# Patient Record
Sex: Female | Born: 1985 | Race: Black or African American | Hispanic: No | Marital: Married | Smoking: Never smoker
Health system: Southern US, Community
[De-identification: ages and names within clinical notes are randomized; demographics above are authoritative.]

## PROBLEM LIST (undated history)

## (undated) DIAGNOSIS — T7840XA Allergy, unspecified, initial encounter: Secondary | ICD-10-CM

## (undated) DIAGNOSIS — E119 Type 2 diabetes mellitus without complications: Secondary | ICD-10-CM

## (undated) DIAGNOSIS — C801 Malignant (primary) neoplasm, unspecified: Secondary | ICD-10-CM

## (undated) DIAGNOSIS — I10 Essential (primary) hypertension: Secondary | ICD-10-CM

## (undated) DIAGNOSIS — D860 Sarcoidosis of lung: Secondary | ICD-10-CM

## (undated) HISTORY — DX: Malignant (primary) neoplasm, unspecified: C80.1

## (undated) HISTORY — DX: Type 2 diabetes mellitus without complications: E11.9

## (undated) HISTORY — PX: LUNG TRANSPLANT, DOUBLE: SHX704

## (undated) HISTORY — DX: Essential (primary) hypertension: I10

## (undated) HISTORY — DX: Sarcoidosis of lung: D86.0

## (undated) HISTORY — DX: Allergy, unspecified, initial encounter: T78.40XA

## (undated) HISTORY — PX: BONE MARROW TRANSPLANT: SHX200

---

## 2012-01-05 DIAGNOSIS — D860 Sarcoidosis of lung: Secondary | ICD-10-CM | POA: Insufficient documentation

## 2012-01-05 DIAGNOSIS — N189 Chronic kidney disease, unspecified: Secondary | ICD-10-CM | POA: Insufficient documentation

## 2012-01-05 DIAGNOSIS — Z862 Personal history of diseases of the blood and blood-forming organs and certain disorders involving the immune mechanism: Secondary | ICD-10-CM | POA: Insufficient documentation

## 2012-01-05 DIAGNOSIS — D649 Anemia, unspecified: Secondary | ICD-10-CM | POA: Insufficient documentation

## 2012-02-22 DIAGNOSIS — A439 Nocardiosis, unspecified: Secondary | ICD-10-CM | POA: Insufficient documentation

## 2012-03-31 ENCOUNTER — Ambulatory Visit: Payer: Self-pay | Admitting: Oncology

## 2012-03-31 LAB — CBC CANCER CENTER
Basophil #: 0.1 x10 3/mm (ref 0.0–0.1)
Basophil %: 2.2 %
Eosinophil #: 0.1 x10 3/mm (ref 0.0–0.7)
Eosinophil %: 1.7 %
HCT: 29 % — ABNORMAL LOW (ref 35.0–47.0)
Lymphocyte #: 1.2 x10 3/mm (ref 1.0–3.6)
Lymphocyte %: 29.7 %
MCH: 26.2 pg (ref 26.0–34.0)
MCHC: 30.3 g/dL — ABNORMAL LOW (ref 32.0–36.0)
MCV: 87 fL (ref 80–100)
Monocyte #: 0.3 x10 3/mm (ref 0.2–0.9)
Monocyte %: 7.2 %
Neutrophil %: 59.2 %
Platelet: 177 x10 3/mm (ref 150–440)
RBC: 3.35 10*6/uL — ABNORMAL LOW (ref 3.80–5.20)
RDW: 14.9 % — ABNORMAL HIGH (ref 11.5–14.5)

## 2012-03-31 LAB — BASIC METABOLIC PANEL
Anion Gap: 7 (ref 7–16)
BUN: 21 mg/dL — ABNORMAL HIGH (ref 7–18)
Creatinine: 1.78 mg/dL — ABNORMAL HIGH (ref 0.60–1.30)
EGFR (African American): 45 — ABNORMAL LOW
EGFR (Non-African Amer.): 38 — ABNORMAL LOW
Glucose: 107 mg/dL — ABNORMAL HIGH (ref 65–99)
Osmolality: 275 (ref 275–301)
Sodium: 136 mmol/L (ref 136–145)

## 2012-03-31 LAB — FOLATE: Folic Acid: 23.5 ng/mL (ref 3.1–100.0)

## 2012-03-31 LAB — RETICULOCYTES: Absolute Retic Count: 0.0424 10*6/uL (ref 0.024–0.084)

## 2012-03-31 LAB — FERRITIN: Ferritin (ARMC): 93 ng/mL (ref 8–388)

## 2012-04-01 LAB — IRON AND TIBC: Iron Bind.Cap.(Total): 291 ug/dL (ref 250–450)

## 2012-04-02 ENCOUNTER — Ambulatory Visit: Payer: Self-pay | Admitting: Oncology

## 2012-04-28 LAB — CANCER CENTER HEMOGLOBIN: HGB: 9.4 g/dL — ABNORMAL LOW (ref 12.0–16.0)

## 2012-05-02 ENCOUNTER — Ambulatory Visit: Payer: Self-pay | Admitting: Oncology

## 2012-05-04 DIAGNOSIS — R0689 Other abnormalities of breathing: Secondary | ICD-10-CM | POA: Insufficient documentation

## 2012-05-04 DIAGNOSIS — G8918 Other acute postprocedural pain: Secondary | ICD-10-CM | POA: Insufficient documentation

## 2012-05-21 DIAGNOSIS — R760 Raised antibody titer: Secondary | ICD-10-CM | POA: Insufficient documentation

## 2012-06-27 DIAGNOSIS — K3184 Gastroparesis: Secondary | ICD-10-CM | POA: Insufficient documentation

## 2012-06-27 DIAGNOSIS — IMO0001 Reserved for inherently not codable concepts without codable children: Secondary | ICD-10-CM | POA: Insufficient documentation

## 2012-07-08 DIAGNOSIS — R05 Cough: Secondary | ICD-10-CM | POA: Insufficient documentation

## 2012-08-17 DIAGNOSIS — I459 Conduction disorder, unspecified: Secondary | ICD-10-CM | POA: Insufficient documentation

## 2012-08-17 DIAGNOSIS — I4891 Unspecified atrial fibrillation: Secondary | ICD-10-CM | POA: Insufficient documentation

## 2012-08-17 DIAGNOSIS — I10 Essential (primary) hypertension: Secondary | ICD-10-CM | POA: Insufficient documentation

## 2012-08-17 DIAGNOSIS — Z7983 Long term (current) use of bisphosphonates: Secondary | ICD-10-CM | POA: Insufficient documentation

## 2012-08-17 DIAGNOSIS — D649 Anemia, unspecified: Secondary | ICD-10-CM | POA: Insufficient documentation

## 2012-08-25 DIAGNOSIS — G723 Periodic paralysis: Secondary | ICD-10-CM | POA: Insufficient documentation

## 2012-09-17 DIAGNOSIS — L0231 Cutaneous abscess of buttock: Secondary | ICD-10-CM | POA: Insufficient documentation

## 2012-09-18 DIAGNOSIS — E119 Type 2 diabetes mellitus without complications: Secondary | ICD-10-CM | POA: Insufficient documentation

## 2012-10-17 DIAGNOSIS — T07XXXA Unspecified multiple injuries, initial encounter: Secondary | ICD-10-CM | POA: Insufficient documentation

## 2012-10-19 DIAGNOSIS — D849 Immunodeficiency, unspecified: Secondary | ICD-10-CM | POA: Insufficient documentation

## 2013-12-15 DIAGNOSIS — R748 Abnormal levels of other serum enzymes: Secondary | ICD-10-CM | POA: Insufficient documentation

## 2014-02-22 DIAGNOSIS — K219 Gastro-esophageal reflux disease without esophagitis: Secondary | ICD-10-CM | POA: Insufficient documentation

## 2014-03-28 DIAGNOSIS — N2889 Other specified disorders of kidney and ureter: Secondary | ICD-10-CM | POA: Insufficient documentation

## 2014-05-08 DIAGNOSIS — N920 Excessive and frequent menstruation with regular cycle: Secondary | ICD-10-CM | POA: Insufficient documentation

## 2014-07-23 DIAGNOSIS — C649 Malignant neoplasm of unspecified kidney, except renal pelvis: Secondary | ICD-10-CM | POA: Insufficient documentation

## 2014-07-23 DIAGNOSIS — R252 Cramp and spasm: Secondary | ICD-10-CM | POA: Insufficient documentation

## 2015-01-15 DIAGNOSIS — D0510 Intraductal carcinoma in situ of unspecified breast: Secondary | ICD-10-CM | POA: Insufficient documentation

## 2015-01-29 DIAGNOSIS — Z1389 Encounter for screening for other disorder: Secondary | ICD-10-CM | POA: Insufficient documentation

## 2015-04-04 DIAGNOSIS — R229 Localized swelling, mass and lump, unspecified: Secondary | ICD-10-CM | POA: Insufficient documentation

## 2015-05-14 ENCOUNTER — Ambulatory Visit: Payer: Self-pay | Admitting: Family Medicine

## 2015-05-14 ENCOUNTER — Ambulatory Visit (INDEPENDENT_AMBULATORY_CARE_PROVIDER_SITE_OTHER): Payer: Medicare Other | Admitting: Family Medicine

## 2015-05-14 ENCOUNTER — Other Ambulatory Visit: Payer: Self-pay | Admitting: Family Medicine

## 2015-05-14 ENCOUNTER — Encounter: Payer: Self-pay | Admitting: Family Medicine

## 2015-05-14 VITALS — BP 104/73 | HR 88 | Temp 98.2°F | Resp 16 | Ht 63.0 in | Wt 129.0 lb

## 2015-05-14 DIAGNOSIS — Z942 Lung transplant status: Secondary | ICD-10-CM | POA: Insufficient documentation

## 2015-05-14 DIAGNOSIS — Z202 Contact with and (suspected) exposure to infections with a predominantly sexual mode of transmission: Secondary | ICD-10-CM

## 2015-05-14 DIAGNOSIS — D849 Immunodeficiency, unspecified: Secondary | ICD-10-CM

## 2015-05-14 DIAGNOSIS — R102 Pelvic and perineal pain: Secondary | ICD-10-CM

## 2015-05-14 DIAGNOSIS — D899 Disorder involving the immune mechanism, unspecified: Secondary | ICD-10-CM

## 2015-05-14 NOTE — Patient Instructions (Addendum)
Please go to [place] to get the majority of STD testing.

## 2015-05-14 NOTE — Progress Notes (Signed)
Subjective:    Patient ID: Hannah Wood, female    DOB: February 26, 1986, 29 y.o.   MRN: [id number]  HPI: Hannah Wood is a 29 y.o. female presenting on 05/14/2015 for Follow-up   HPI  Pt presents today to request STD testing. Currently sexually active. 1 partner- she is unsure if he is monogamous with her. Unsure of STD exposure. Would like testing today.She is also reporting intermittent pelvic pain without vaginal discharge. 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   Past Medical History  Diagnosis Date  . Cancer   . Diabetes mellitus without complication   . Sarcoidosis of lung   . Allergy   . Hypertension    Past Surgical History  Procedure Laterality Date  . Lung transplant, double Bilateral 2007, 2012  . Bone marrow transplant      No current outpatient prescriptions on file prior to visit.   No current facility-administered medications on file prior to visit.    Review of Systems  Constitutional: Negative for fever and chills.  Respiratory: Negative for chest tightness and shortness of breath.   Cardiovascular: Negative for chest pain and palpitations.  Gastrointestinal: Negative for nausea and vomiting.  Genitourinary: Positive for pelvic pain. Negative for dysuria, vaginal bleeding, vaginal discharge, genital sores and vaginal pain.   Per HPI unless specifically indicated above     Objective:    BP 104/73 mmHg  Pulse 88  Temp(Src) 98.2 F (36.8 C) (Oral)  Resp 16  Ht 5\' 3"  (1.6 m)  Wt 129 lb (58.514 kg)  BMI 22.86 kg/m2  LMP 05/11/2015  Wt Readings from Last 3 Encounters:  05/14/15 129 lb (58.514 kg)    Physical Exam  Constitutional: She appears well-developed and well-nourished. No distress.  Cardiovascular: Normal rate and regular rhythm.  Exam reveals no gallop and no friction rub.   No murmur heard. Pulmonary/Chest: Effort normal and breath sounds normal. She has no wheezes. She has no rales.   Genitourinary:  Pt declined internal exam today.   Skin: Skin is warm and dry. No rash noted. She is not diaphoretic. No erythema.   Results for orders placed or performed in visit on 04/02/12  [place] Hemoglobin  Result Value Ref Range   HGB 9.4 (L) 12.0-16.0 g/dL      Assessment & Plan:   Problem List Items Addressed This Visit      Other   Immunosuppressed status   Relevant Orders   Hepatitis B Virus (Profile VI)   Hepatitis C Antibody    Other Visit Diagnoses    Possible exposure to STD    -  Primary    Test for Hep B and C due to patient immunosuppressed status.     Relevant Orders    Hepatitis B Virus (Profile VI)    Hepatitis C Antibody    History of transplantation, lung        Relevant Orders    Hepatitis B Virus (Profile VI)    Hepatitis C Antibody    Pelvic pain in female        Test for GC/chlamydia via urine due to pt declining pelvic exam.     Relevant Orders    GC Probe amplification, urine       Meds ordered this encounter  Medications  . DISCONTD: albuterol (PROVENTIL) (2.5 MG/3ML) 0.083% nebulizer solution    Sig: Inhale into the lungs.  . Calcium Carbonate-Vitamin D (PX CALCIUM&D) 600-400 MG-UNIT per tablet  Sig: Take 1 tablet twice a day.  Marland Kitchen DISCONTD: fluticasone (FLONASE) 50 MCG/ACT nasal spray    Sig: Place into the nose.  Marland Kitchen DISCONTD: hydrocortisone 1 % ointment    Sig: Apply topically.  . insulin regular (NOVOLIN R,HUMULIN R) 100 units/mL injection    Sig: Sliding scale 2- 10 u as needed per sliding scale  . Insulin Syringe-Needle U-100 (INSULIN SYRINGE 1CC/31GX5/16") 31G X 5/16" 1 ML MISC    Sig: Use 4 (four) times daily as needed.  Marland Kitchen DISCONTD: Melatonin 3 MG TABS    Sig: Take by mouth.  . ondansetron (ZOFRAN) 4 MG tablet    Sig: Take by mouth.  . Prenatal Vit-Fe Fumarate-FA (VOL-PLUS) 27-1 MG TABS    Sig: Take by mouth.  . DISCONTD: promethazine (PHENERGAN) 25 MG tablet    Sig: Take by mouth.  . DISCONTD:  sulfamethoxazole-trimethoprim (BACTRIM,SEPTRA) 400-80 MG per tablet    Sig: Take by mouth.  . DISCONTD: temazepam (RESTORIL) 15 MG capsule    Sig: Take by mouth.  . DISCONTD: lansoprazole (PREVACID) 30 MG capsule    Sig: Take by mouth.  . DISCONTD: RA ANTI-ITCH MAXIMUM STRENGTH 1 % ointment    Sig: apply to affected area twice a day    Refill:  0      Follow up plan: Return if symptoms worsen or fail to improve.

## 2015-05-22 LAB — GC/CHLAMYDIA PROBE AMP
CHLAMYDIA, DNA PROBE: NEGATIVE
NEISSERIA GONORRHOEAE BY PCR: NEGATIVE

## 2015-05-22 LAB — SPECIMEN STATUS REPORT

## 2015-05-23 NOTE — Progress Notes (Signed)
Attempted to call but no reply from pt so letter send.

## 2016-11-10 ENCOUNTER — Encounter: Payer: Self-pay | Admitting: Family Medicine

## 2016-11-10 ENCOUNTER — Ambulatory Visit (INDEPENDENT_AMBULATORY_CARE_PROVIDER_SITE_OTHER): Payer: Managed Care, Other (non HMO) | Admitting: Family Medicine

## 2016-11-10 VITALS — BP 116/83 | HR 93 | Temp 98.3°F | Resp 16 | Ht 63.0 in | Wt 124.6 lb

## 2016-11-10 DIAGNOSIS — R079 Chest pain, unspecified: Secondary | ICD-10-CM

## 2016-11-10 MED ORDER — ASPIRIN 81 MG PO CHEW: 324.0000 mg | CHEWABLE_TABLET | Freq: Once | ORAL | Status: AC

## 2016-11-10 NOTE — Patient Instructions (Signed)
Thank you for coming in to clinic today.  We will contact your coordinator and share this record.  I do recommend that you go straight to [facility] Emergency Department for further evaluation of your chest pain, may need repeat EKG, monitoring, blood work for heart enzymes. Follow-up with your team as planned.  Please schedule a follow-up appointment with Dr. Parks Ranger as needed  If you have any other questions or concerns, please feel free to call the clinic or send a message through [place]. You may also schedule an earlier appointment if necessary.  Nobie Putnam, DO [place]

## 2016-11-10 NOTE — Progress Notes (Signed)
See office note from today 11/10/16 for full clinical details on this patient encounter.  Additional documentation regarding EKG.  First EKG performed in our office at approx 1742 during her office visit, this EKG was read and interpreted, and copy given to patient. Shortly after patient had returned to office after identifying that EKG printout she was given has the incorrect patient demographics on it. Our staff identified the mistake, and the EKG results were accurate but the demographics were carried over in the machine from the previous patient by mistake. Patient was debriefed and she understood the mistake, upon patient returning to office, with unchanged symptoms, we opted to repeat EKG at approx 1818 with appropriate demographics entered, again interpreted and results were unchanged from previous. She was given printed copy of the repeat EKG with correct demographics, and then advised to go directly to [facility] ED for further evaluation.  Note patient was not advised to come directly back to our office to repeat EKG, she identified this problem soon after leaving and returned on her own to bring this to our attention. She agreed with this plan to repeat the EKG and then to be discharged by personal vehicle, with her family driving.  Nobie Putnam, [facility] 11/10/2016, 5:55 PM

## 2016-11-10 NOTE — Progress Notes (Addendum)
Subjective:    Patient ID: Hannah Wood, female    DOB: 05-Aug-1986, 31 y.o.   MRN: [id number]  Hannah Wood is a 31 y.o. female presenting on 11/10/2016 for Chest Pain (pt had breast cancer and had implants )  Patient presents for a same day appointment.  Note patient was previously established with Fredia Sorrow FNP here at [facility] as needed for acute issues, however majority of her medical care is provided by her team at [facility]. I had not previously met patient before today, and she has not established care with me prior to this new acute complaint.  HPI   CHEST PAIN AND PRESSURE: - Reports symptoms started today after waking up this morning with chest pain, pressure and tightness, does not seem to be worsening but not improved, persistent symptom. Localized to mid sternal to left side of chest, without radiation. Not associated with dyspnea or shortness of breath, sweating, nausea, vomiting, lightheadedness, dizziness, weakness, numbness or tingling. Does not seem to be related to exertion. Did not take any medicine for this today. Has had similar chest pressure in past, but that was more related to different pulmonary problem. - Recent changes with on Herceptin for breast cancer, stopped this about 3 months ago, was advised could cause some cardiac strain, but did not have problems tolerating it while taking the medicine - Complex PMH followed primarily at [facility] for most of her medical care with variety of specialists on her team including Transplant Team, Dr Doy Mince (Pulmonology), Oncology, Nephrology. 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 - Denies any known prior cardiac history. Last ECHO 04/2016 with normal LVEF   Social History  Substance Use Topics  . Smoking status: Never Smoker  . Smokeless  tobacco: Never Used  . Alcohol use No    Review of Systems Per HPI unless specifically indicated above     Objective:    BP 116/83   Pulse 93   Temp 98.3 F (36.8 C) (Oral)   Resp 16   Ht '5\' 3"'  (1.6 m)   Wt 124 lb 9.6 oz (56.5 kg)   BMI 22.07 kg/m   Wt Readings from Last 3 Encounters:  11/10/16 124 lb 9.6 oz (56.5 kg)  05/14/15 129 lb (58.5 kg)    Physical Exam  Constitutional: She is oriented to person, place, and time. She appears well-developed and well-nourished. No distress.  Well-appearing, comfortable, cooperative  HENT:  Mouth/Throat: Oropharynx is clear and moist.  Eyes: Conjunctivae are normal.  Neck: Normal range of motion. Neck supple.  Cardiovascular: Normal rate, regular rhythm, normal heart sounds and intact distal pulses.   No murmur heard. Pulmonary/Chest: Effort normal and breath sounds normal. No respiratory distress. She has no wheezes. She has no rales.  Good air movement. Speaks full sentences.  Musculoskeletal: She exhibits no edema.  Neurological: She is alert and oriented to person, place, and time.  Skin: Skin is warm and dry. No rash noted. She is not diaphoretic. No erythema.  Psychiatric: Her behavior is normal.  Nursing note and vitals reviewed.  EKG performed today at 1818 Sinus rhythm with LVH also with significant T wave inversion anterolateral leads and associated repolarization abnormality with ST segment depression, cannot rule out ischemia.  No actual EKG image for comparison. Old interpretation of prior EKG available in [facility] from 05/2015 with  description of similar non specific T wave abnormality, LVH repolarization abnormality. However cannot compare actual images.  ----------------------- I have personally reviewed the radiology report from Right breast ultrasound on 10/29/16, available in [place]- POST MASTECTOMY: 10/29/2016  CLINICAL: N63.0 Unspecified Lump In  Unspecified Breast.    Comparison is made to prior exams.  Real-time ultrasound of the right breast was performed on the palpable area  of interest at the 2-4 o'clock position of the subareolar right breast.    Physical exam of the of the right breast in the palpable area of concern   demonstrated no discrete mass although prominent tissue was palpated.  Ultrasound demonstrates no discrete cystic or solid mass at the site of   palpable abnormality, although a very small amount of fluid is present in   fold of the patient's implant reconstruction.      IMPRESSION: BENIGN   No sonographic abnormality to correlate with the palpable abnormality on   the right.The palpable abnormality corresponds to a normal-appearing fold  in the patient's implant reconstruction. Further workup should be based on  clinical grounds.  There is no sonographic evidence of malignancy.    The patient was notified of the results.      Electronically signed by: Elinor Parkinson Baker M.D.  Electronically signed on: jab/:10/29/2016 14:09:23    Imaging Technologist: [facility] [place]   Imaging  letter sent: Norm Mamm/Abnorm Hist  Ultrasound BI-RADS: 2 Benign       Assessment & Plan:   Problem List Items Addressed This Visit    None    Visit Diagnoses    Chest pain in adult    -  Primary  Significantly concerning chest pain/pressure/tightness in complex patient today with acute onset since this morning, persistent symptom without improvement or worsening, has some atypical features. Given history of bilateral lung transplants, h/o breast cancer, s/p breast implants, recently on hormone therapy for cancer, there is a much wider differential for her current symptom. Reassuring that she is hemodynamically stable, appears comfortable, cardiopulmonary exam is normal, ACS is less likely but cannot rule it out. - Proceeded to EKG in office,  see results in chart, with significant concern with T wave inversions V2 through V6, more pronounced V4-V6 with possible ST wave depression vs repolarization with LVH, no actual EKG to compare to available, only last EKG in [facility] 2016 but only interpretation without images  Plan: 1. EKG, copy of EKG given to patient in hand in office today, for her to bring to ED - note patient did return to office after leaving initially after having problem with demographics on first EKG, see separate documentation note. 2. Given chewable aspirin 28m x 4 tabs x 1 dose in office, had not taken aspirin earlier, can tolerate ASA without allergy, but not able to take other NSAIDs 3. [facility] (to travel by personal vehicle, husband is driver), however patient was hesitant about going to ED for this issue, she [facility] were unable to see her today and wanted her to get this checked out today, she opted to not go to Urgent Care. I advised her that given this is my first time meeting her, given her acute concerning complaint, and her complex medical history, especially with concerning findings on her EKG, I do not feel comfortable making other recommendations or follow-up plans, she should seek medical  treatment tonight for further work-up with labs including troponin heart enzymes and other testing, I told her that outpatient labs would not be an ideal option as we do not have available lab draw in this office in afternoons and would be a send out, not appropriate to check STAT labs and make decision later given her complaint. 4. At her request, contacted the provided # for [facility] paging operator, spoke with on call Transpant Team Eliezer Bottom), they will anticipate patient's arrival in ED and notify the Transplant Team on call if needed, also they will create a note in chart 5. Advised patient to follow-up with her team at [facility]  by phone as soon as she can likely tomorrow, unless they are involved by ED tonight, if discharged from ED should contact them 6. Return criteria reviewed    Relevant Medications   aspirin chewable tablet 324 mg   Other Relevant Orders   EKG 12-Lead      Meds ordered this encounter  Medications  .       .       .           .       .           . aspirin chewable tablet 324 mg      Follow up plan: Return in about 1 week (around 11/17/2016), or if symptoms worsen or fail to improve.  Nobie Putnam, DO [facility] 11/10/2016, 5:46 PM

## 2018-03-22 ENCOUNTER — Ambulatory Visit: Payer: Managed Care, Other (non HMO) | Admitting: Family Medicine

## 2022-04-04 ENCOUNTER — Other Ambulatory Visit
Admission: RE | Admit: 2022-04-04 | Discharge: 2022-04-04 | Disposition: A | Discharge status: Home / Self Care | Payer: Medicare HMO | Source: Ambulatory Visit | Attending: *Deleted | Admitting: *Deleted

## 2022-04-04 DIAGNOSIS — R4182 Altered mental status, unspecified: Secondary | ICD-10-CM | POA: Diagnosis not present

## 2022-04-04 DIAGNOSIS — T86810 Lung transplant rejection: Secondary | ICD-10-CM | POA: Diagnosis not present

## 2022-04-04 DIAGNOSIS — E875 Hyperkalemia: Secondary | ICD-10-CM | POA: Insufficient documentation

## 2022-04-04 LAB — POTASSIUM: Potassium: 4.4 mmol/L (ref 3.5–5.1)

## 2022-04-05 ENCOUNTER — Emergency Department: Payer: Medicare HMO

## 2022-04-05 ENCOUNTER — Encounter: Payer: Self-pay | Admitting: Emergency Medicine

## 2022-04-05 ENCOUNTER — Other Ambulatory Visit: Payer: Self-pay

## 2022-04-05 ENCOUNTER — Inpatient Hospital Stay
Admission: EM | Admit: 2022-04-05 | Discharge: 2022-05-02 | DRG: 205 | Disposition: E | Payer: Medicare HMO | Attending: Pulmonary Disease | Admitting: Pulmonary Disease

## 2022-04-05 DIAGNOSIS — E43 Unspecified severe protein-calorie malnutrition: Secondary | ICD-10-CM | POA: Diagnosis present

## 2022-04-05 DIAGNOSIS — Z20822 Contact with and (suspected) exposure to covid-19: Secondary | ICD-10-CM | POA: Diagnosis present

## 2022-04-05 DIAGNOSIS — Z681 Body mass index (BMI) 19 or less, adult: Secondary | ICD-10-CM | POA: Diagnosis not present

## 2022-04-05 DIAGNOSIS — R570 Cardiogenic shock: Secondary | ICD-10-CM | POA: Diagnosis present

## 2022-04-05 DIAGNOSIS — E1122 Type 2 diabetes mellitus with diabetic chronic kidney disease: Secondary | ICD-10-CM | POA: Diagnosis present

## 2022-04-05 DIAGNOSIS — Z86010 Personal history of colonic polyps: Secondary | ICD-10-CM

## 2022-04-05 DIAGNOSIS — E8729 Other acidosis: Secondary | ICD-10-CM | POA: Diagnosis present

## 2022-04-05 DIAGNOSIS — Z9049 Acquired absence of other specified parts of digestive tract: Secondary | ICD-10-CM

## 2022-04-05 DIAGNOSIS — Z9013 Acquired absence of bilateral breasts and nipples: Secondary | ICD-10-CM

## 2022-04-05 DIAGNOSIS — T86810 Lung transplant rejection: Principal | ICD-10-CM | POA: Diagnosis present

## 2022-04-05 DIAGNOSIS — D860 Sarcoidosis of lung: Secondary | ICD-10-CM | POA: Diagnosis present

## 2022-04-05 DIAGNOSIS — E041 Nontoxic single thyroid nodule: Secondary | ICD-10-CM | POA: Diagnosis present

## 2022-04-05 DIAGNOSIS — J690 Pneumonitis due to inhalation of food and vomit: Secondary | ICD-10-CM | POA: Diagnosis present

## 2022-04-05 DIAGNOSIS — Z9981 Dependence on supplemental oxygen: Secondary | ICD-10-CM

## 2022-04-05 DIAGNOSIS — Z9109 Other allergy status, other than to drugs and biological substances: Secondary | ICD-10-CM

## 2022-04-05 DIAGNOSIS — N179 Acute kidney failure, unspecified: Secondary | ICD-10-CM | POA: Diagnosis present

## 2022-04-05 DIAGNOSIS — I1311 Hypertensive heart and chronic kidney disease without heart failure, with stage 5 chronic kidney disease, or end stage renal disease: Secondary | ICD-10-CM | POA: Diagnosis present

## 2022-04-05 DIAGNOSIS — R4182 Altered mental status, unspecified: Secondary | ICD-10-CM | POA: Diagnosis present

## 2022-04-05 DIAGNOSIS — Z85038 Personal history of other malignant neoplasm of large intestine: Secondary | ICD-10-CM

## 2022-04-05 DIAGNOSIS — C7A8 Other malignant neuroendocrine tumors: Secondary | ICD-10-CM | POA: Diagnosis present

## 2022-04-05 DIAGNOSIS — D631 Anemia in chronic kidney disease: Secondary | ICD-10-CM | POA: Diagnosis present

## 2022-04-05 DIAGNOSIS — N186 End stage renal disease: Secondary | ICD-10-CM | POA: Diagnosis present

## 2022-04-05 DIAGNOSIS — K769 Liver disease, unspecified: Secondary | ICD-10-CM | POA: Diagnosis present

## 2022-04-05 DIAGNOSIS — J9621 Acute and chronic respiratory failure with hypoxia: Secondary | ICD-10-CM

## 2022-04-05 DIAGNOSIS — Z794 Long term (current) use of insulin: Secondary | ICD-10-CM

## 2022-04-05 DIAGNOSIS — Z515 Encounter for palliative care: Secondary | ICD-10-CM | POA: Diagnosis not present

## 2022-04-05 DIAGNOSIS — Z7951 Long term (current) use of inhaled steroids: Secondary | ICD-10-CM

## 2022-04-05 DIAGNOSIS — G9341 Metabolic encephalopathy: Secondary | ICD-10-CM | POA: Diagnosis present

## 2022-04-05 DIAGNOSIS — T86818 Other complications of lung transplant: Secondary | ICD-10-CM | POA: Diagnosis present

## 2022-04-05 DIAGNOSIS — Z888 Allergy status to other drugs, medicaments and biological substances status: Secondary | ICD-10-CM

## 2022-04-05 DIAGNOSIS — D849 Immunodeficiency, unspecified: Secondary | ICD-10-CM | POA: Diagnosis present

## 2022-04-05 DIAGNOSIS — Z9221 Personal history of antineoplastic chemotherapy: Secondary | ICD-10-CM

## 2022-04-05 DIAGNOSIS — J9600 Acute respiratory failure, unspecified whether with hypoxia or hypercapnia: Secondary | ICD-10-CM | POA: Diagnosis present

## 2022-04-05 DIAGNOSIS — Z833 Family history of diabetes mellitus: Secondary | ICD-10-CM

## 2022-04-05 DIAGNOSIS — R4189 Other symptoms and signs involving cognitive functions and awareness: Principal | ICD-10-CM

## 2022-04-05 DIAGNOSIS — Y830 Surgical operation with transplant of whole organ as the cause of abnormal reaction of the patient, or of later complication, without mention of misadventure at the time of the procedure: Secondary | ICD-10-CM | POA: Diagnosis present

## 2022-04-05 DIAGNOSIS — J90 Pleural effusion, not elsewhere classified: Secondary | ICD-10-CM | POA: Diagnosis present

## 2022-04-05 DIAGNOSIS — J9622 Acute and chronic respiratory failure with hypercapnia: Secondary | ICD-10-CM | POA: Diagnosis present

## 2022-04-05 DIAGNOSIS — R627 Adult failure to thrive: Secondary | ICD-10-CM | POA: Diagnosis present

## 2022-04-05 DIAGNOSIS — R9431 Abnormal electrocardiogram [ECG] [EKG]: Secondary | ICD-10-CM | POA: Diagnosis present

## 2022-04-05 DIAGNOSIS — Z992 Dependence on renal dialysis: Secondary | ICD-10-CM | POA: Diagnosis not present

## 2022-04-05 DIAGNOSIS — Z66 Do not resuscitate: Secondary | ICD-10-CM | POA: Diagnosis present

## 2022-04-05 DIAGNOSIS — Z85528 Personal history of other malignant neoplasm of kidney: Secondary | ICD-10-CM

## 2022-04-05 DIAGNOSIS — Z7952 Long term (current) use of systemic steroids: Secondary | ICD-10-CM

## 2022-04-05 DIAGNOSIS — Z792 Long term (current) use of antibiotics: Secondary | ICD-10-CM

## 2022-04-05 DIAGNOSIS — Z853 Personal history of malignant neoplasm of breast: Secondary | ICD-10-CM

## 2022-04-05 DIAGNOSIS — Z882 Allergy status to sulfonamides status: Secondary | ICD-10-CM

## 2022-04-05 DIAGNOSIS — Z79899 Other long term (current) drug therapy: Secondary | ICD-10-CM

## 2022-04-05 DIAGNOSIS — D126 Benign neoplasm of colon, unspecified: Secondary | ICD-10-CM | POA: Diagnosis present

## 2022-04-05 LAB — COMPREHENSIVE METABOLIC PANEL
ALT: 19 U/L (ref 0–44)
AST: 24 U/L (ref 15–41)
Albumin: 3.3 g/dL — ABNORMAL LOW (ref 3.5–5.0)
Alkaline Phosphatase: 119 U/L (ref 38–126)
Anion gap: 3 — ABNORMAL LOW (ref 5–15)
BUN: 26 mg/dL — ABNORMAL HIGH (ref 6–20)
CO2: 31 mmol/L (ref 22–32)
Calcium: 8.9 mg/dL (ref 8.9–10.3)
Chloride: 101 mmol/L (ref 98–111)
Creatinine, Ser: 3.54 mg/dL — ABNORMAL HIGH (ref 0.44–1.00)
GFR, Estimated: 16 mL/min — ABNORMAL LOW (ref >60)
Glucose, Bld: 120 mg/dL — ABNORMAL HIGH (ref 70–99)
Potassium: 5 mmol/L (ref 3.5–5.1)
Sodium: 135 mmol/L (ref 135–145)
Total Bilirubin: 0.6 mg/dL (ref 0.3–1.2)
Total Protein: 6.9 g/dL (ref 6.5–8.1)

## 2022-04-05 LAB — CBC
HCT: 34.1 % — ABNORMAL LOW (ref 36.0–46.0)
Hemoglobin: 9.2 g/dL — ABNORMAL LOW (ref 12.0–15.0)
MCH: 29.2 pg (ref 26.0–34.0)
MCHC: 27 g/dL — ABNORMAL LOW (ref 30.0–36.0)
MCV: 108.3 fL — ABNORMAL HIGH (ref 80.0–100.0)
Platelets: 99 10*3/uL — ABNORMAL LOW (ref 150–400)
RBC: 3.15 MIL/uL — ABNORMAL LOW (ref 3.87–5.11)
RDW: 16 % — ABNORMAL HIGH (ref 11.5–15.5)
WBC: 7.6 10*3/uL (ref 4.0–10.5)
nRBC: 2.4 % — ABNORMAL HIGH (ref 0.0–0.2)

## 2022-04-05 LAB — PHOSPHORUS: Phosphorus: 7.8 mg/dL — ABNORMAL HIGH (ref 2.5–4.6)

## 2022-04-05 LAB — MAGNESIUM: Magnesium: 2.8 mg/dL — ABNORMAL HIGH (ref 1.7–2.4)

## 2022-04-05 LAB — PROCALCITONIN: Procalcitonin: 0.45 ng/mL

## 2022-04-05 LAB — SARS CORONAVIRUS 2 BY RT PCR: SARS Coronavirus 2 by RT PCR: NEGATIVE

## 2022-04-05 MED ORDER — DOCUSATE SODIUM 100 MG PO CAPS: 100.0000 mg | ORAL_CAPSULE | Freq: Two times a day (BID) | ORAL | Status: DC | PRN

## 2022-04-05 MED ORDER — ATROPINE SULFATE 1 MG/10ML IJ SOSY
0.2000 mg | PREFILLED_SYRINGE | Freq: Once | INTRAMUSCULAR | Status: AC
  Administered 2022-04-05: 0.2 mg via INTRAVENOUS
  Filled 2022-04-05: qty 10

## 2022-04-05 MED ORDER — SODIUM CHLORIDE 0.9 % IV SOLN: 3.0000 g | INTRAVENOUS | Status: DC

## 2022-04-05 MED ORDER — HEPARIN SODIUM (PORCINE) 5000 UNIT/ML IJ SOLN
5000.0000 [IU] | Freq: Three times a day (TID) | INTRAMUSCULAR | Status: DC
  Administered 2022-04-06 (×3): 5000 [IU] via SUBCUTANEOUS
  Filled 2022-04-05 (×3): qty 1

## 2022-04-05 MED ORDER — LORAZEPAM 2 MG/ML IJ SOLN
0.5000 mg | Freq: Once | INTRAMUSCULAR | Status: AC
  Administered 2022-04-05: 0.5 mg via INTRAVENOUS
  Filled 2022-04-05: qty 1

## 2022-04-05 MED ORDER — NOREPINEPHRINE 16 MG/250ML-% IV SOLN
0.0000 ug/min | INTRAVENOUS | Status: DC
  Administered 2022-04-05: 4 ug/min via INTRAVENOUS
  Filled 2022-04-05: qty 250

## 2022-04-05 MED ORDER — PANTOPRAZOLE SODIUM 40 MG IV SOLR
40.0000 mg | Freq: Every day | INTRAVENOUS | Status: DC
  Administered 2022-04-06: 40 mg via INTRAVENOUS
  Filled 2022-04-05: qty 10

## 2022-04-05 MED ORDER — VASOPRESSIN 20 UNITS/100 ML INFUSION FOR SHOCK
0.0000 [IU]/min | INTRAVENOUS | Status: DC
  Administered 2022-04-05: 0.04 [IU]/min via INTRAVENOUS
  Administered 2022-04-06: 0.03 [IU]/min via INTRAVENOUS
  Filled 2022-04-05 (×2): qty 100

## 2022-04-05 MED ORDER — SODIUM CHLORIDE 0.9 % IV SOLN: 1.5000 g | INTRAVENOUS | Status: DC

## 2022-04-05 MED ORDER — METHYLPREDNISOLONE SODIUM SUCC 40 MG IJ SOLR
20.0000 mg | Freq: Two times a day (BID) | INTRAMUSCULAR | Status: DC
  Administered 2022-04-06 (×2): 20 mg via INTRAVENOUS
  Filled 2022-04-05: qty 1

## 2022-04-05 MED ORDER — SODIUM CHLORIDE 0.9 % IV BOLUS
500.0000 mL | Freq: Once | INTRAVENOUS | Status: AC
  Administered 2022-04-05: 500 mL via INTRAVENOUS

## 2022-04-05 MED ORDER — POLYETHYLENE GLYCOL 3350 17 G PO PACK
17.0000 g | PACK | Freq: Every day | ORAL | Status: DC | PRN
Start: 2022-04-05 — End: 2022-04-06

## 2022-04-05 MED ORDER — NOREPINEPHRINE 4 MG/250ML-% IV SOLN
0.0000 ug/min | INTRAVENOUS | Status: DC
  Administered 2022-04-05: 2 ug/min via INTRAVENOUS
  Filled 2022-04-05: qty 250

## 2022-04-05 MED ORDER — SODIUM BICARBONATE 8.4 % IV SOLN
150.0000 meq | Freq: Once | INTRAVENOUS | Status: AC
  Administered 2022-04-05: 150 meq via INTRAVENOUS
  Filled 2022-04-05: qty 50

## 2022-04-05 MED ORDER — SODIUM CHLORIDE 0.9 % IV SOLN
1.5000 g | INTRAVENOUS | Status: DC
  Administered 2022-04-06: 1.5 g via INTRAVENOUS
  Filled 2022-04-05 (×2): qty 4

## 2022-04-05 NOTE — ED Notes (Signed)
Called Hannah Wood) to page Pulmonary Post Transplant MD

## 2022-04-05 NOTE — ED Triage Notes (Signed)
Pt arrives via [facility] family c/o reduced LOC. 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 Kidney's not working properly as well.  Pt is on 4L chronic, currently at 15L NRB and 92% per EMS.  Pt is alert, speaking with incomprehensible words at this time.

## 2022-04-05 NOTE — Progress Notes (Signed)
Pharmacy Antibiotic Note  Hannah Wood is a 36 y.o. female w/ ESRD on HD admitted on 04/22/2022 with aspiration pneumonia.  Pharmacy has been consulted for Unasyn dosing.  Plan: {Assessment:21075}  Height: '5\' 3"'$  (160 cm) Weight: 36.3 kg (80 lb) IBW/kg (Calculated) : 52.4  No data recorded.  Recent Labs  Lab 04/15/2022 2005  WBC 7.6  CREATININE 3.54*    Estimated Creatinine Clearance: 12.5 mL/min (A) (by C-G formula based on SCr of 3.54 mg/dL (H)).    Allergies  Allergen Reactions   Other Rash and Other (See Comments)    transplant Reacts to prograf   Metoclopramide Other (See Comments)    On high dose reglan 10 qid she had altered mental status, jittery, myoclonic jerks. Doing ok on lower dose reglan 5 tid.   Sulfamethoxazole-Trimethoprim Nausea Only    High Doses of Bactrim   Oxaliplatin Itching   Trastuzumab Other (See Comments)    Migraines    Benzalkonium Chloride Rash    Patient Allergic to the Blue Bandages ([place])    Antimicrobials this admission: *** *** >> *** *** *** >> ***  Dose adjustments this admission: ***  Microbiology results: *** BCx: *** *** UCx: ***  *** Sputum: ***  *** MRSA PCR: ***  Thank you for allowing pharmacy to be a part of this patient's care.  Renda Rolls, PharmD, [facility] 04/20/2022 11:17 PM

## 2022-04-05 NOTE — ED Notes (Signed)
[place],MD ( Pulmonary Post Transplant MD) from [place] returned call to [facility], MD

## 2022-04-05 NOTE — Progress Notes (Signed)
VBG obtained per RN.  PCO2Bicarb critical levels outside of reportable range. Information relayed to ICU NP and Dr Kerman Passey.

## 2022-04-05 NOTE — ED Provider Notes (Signed)
[facility] Provider Note    Event Date/Time   First MD Initiated Contact with Patient 04/08/2022 1955     (approximate)  History   Chief Complaint: Loss of Consciousness  HPI  Hannah Wood is a 36 y.o. female with a past medical history of stage IV cancer, sarcoidosis of the lungs, status post bilateral lung transplant 2007, and again in 2013, breast cancer status post double mastectomy, colon cancer, status postchemotherapy which appeared to have stopped at the end of last year, presents to the emergency department for unresponsiveness.  According to the husband states normally the patient is able to talk to him however sleeps 16 or 18 hours of each day.  Tonight patient noted to be unresponsive.  Husband is here with the patient who states they started palliative care.  He does not want life support or resuscitation but he is agreeable to medical work-up including lab work and a chest x-ray.  I discussed with him given the patient's cancer history with metastases obtaining CT imaging of the head, he states he does not want a CT scan currently as it would not change management even if we found a bleed, which is very reasonable.  In reviewing the patient's last transplant note from 03/25/2022 patient has had multiple lung transplants, multiple cancers most recently colon cancer, with lung rejection.  Patient is immunosuppressed.  Chronic kidney disease on dialysis Saturday and Tuesdays.  Physical Exam   Triage Vital Signs: ED Triage Vitals  Enc Vitals Group     BP 04/02/2022 1950 (!) 93/56     Pulse Rate 04/30/2022 1950 62     Resp 04/22/2022 1950 10     Temp --      Temp Source 04/06/2022 1950 Axillary     SpO2 04/10/2022 1941 (!) 82 %     Weight 04/18/2022 1951 80 lb (36.3 kg)     Height 05/01/2022 1951 '5\' 3"'$  (1.6 m)     Head Circumference --      Peak Flow --      Pain Score --      Pain Loc --      Pain Edu? --      Excl. in [place]? --     Most recent vital  signs: Vitals:   04/27/2022 1948 04/10/2022 1950  BP:  (!) 93/56  Pulse:  62  Resp:  10  SpO2: 91% (!) 15%    General: Unresponsive but eyes are open.  Will occasionally move her head.  Not answering questions or following commands.  Secretions in mouth. CV:  Good peripheral perfusion.  Regular rate and rhythm around 70 bpm Resp:  Normal effort.  Patient has some rhonchi on exam most consistent with upper airway secretions.  Right subclavian line present.  Abd:  No distention.  Soft, nontender.  No rebound or guarding. Other:  Unresponsive to painful stimuli.  Not answering questions or following commands.  Currently on a nonrebreather mask satting around 90%.   ED Results / Procedures / Treatments   EKG  EKG viewed and interpreted by myself shows sinus rhythm at 62 bpm with a narrow QRS, normal axis, largely normal intervals, nonspecific ST changes.  RADIOLOGY  I have reviewed the chest x-ray images on my interpretation patient has what appears to be pulmonary edema bilaterally. Radiology is read the chest x-ray is cardiomegaly with vascular congestion and probable pleural effusions.  Fairly extensive bilateral airspace disease   MEDICATIONS ORDERED IN ED: Medications  sodium chloride 0.9 % bolus 500 mL (has no administration in time range)     IMPRESSION / MDM / ASSESSMENT AND PLAN / ED COURSE  I reviewed the triage vital signs and the nursing notes.  Patient's presentation is most consistent with acute presentation with potential threat to life or bodily function.  Patient presents emergency department unresponsive from home.  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  Husband is here who is quite reasonable.  Does not want resuscitation compressions intubation or life support.  Patient  is on palliative care at home per husband.  Husband is agreeable to work-up to include lab work and fluids if needed chest x-ray.  Recommended head CT however husband states he would like to hold off as it would not change management even if there was findings of bleed, which is reasonable.  We will discuss the patient with [facility] transplant team who is requested a call for further management and their expertise.  I spoke to Dr. Karrie Meres of the [facility] transplant team.  Nothing really additional to add at this point but he will make the main team aware that she is going to be admitted to our hospital.  Patient's lab work shows a fairly reassuring CBC.  Chemistry is pending.  Patient is hypotensive 80/53 reluctant to give any additional fluids over the 500 cc bolus that she is gotten due to her chest x-ray findings.  Spoke to the husband who would like her placed on pressors.  We will start norepinephrine.  We will start BiPAP.  I spoke to the intensive care unit who will be admitting to their service.  Chemistry shows a potassium of 5.0, creatinine 3.5.  Patient continues to have significant oral secretions.  We will dose 0.2 mg of IV atropine.   CRITICAL CARE Performed by: Harvest Dark   Total critical care time: 45 minutes  Critical care time was exclusive of separately billable procedures and treating other patients.  Critical care was necessary to treat or prevent imminent or life-threatening deterioration.  Critical care was time spent personally by me on the following activities: development of treatment plan with patient and/or surrogate as well as nursing, discussions with consultants, evaluation of patient's response to treatment, examination of patient, obtaining history from patient or surrogate, ordering and performing treatments and interventions, ordering and review of laboratory studies, ordering and review of radiographic studies, pulse oximetry and re-evaluation of patient's  condition.   FINAL CLINICAL IMPRESSION(S) / ED DIAGNOSES   Unresponsiveness Pulmonary edema Hypoxia  Note:  This document was prepared using Dragon voice recognition software and may include unintentional dictation errors.   Harvest Dark, MD 05/01/2022 2105

## 2022-04-06 DIAGNOSIS — J9621 Acute and chronic respiratory failure with hypoxia: Secondary | ICD-10-CM

## 2022-04-06 DIAGNOSIS — J9622 Acute and chronic respiratory failure with hypercapnia: Secondary | ICD-10-CM | POA: Diagnosis not present

## 2022-04-06 LAB — CBC
HCT: 31.8 % — ABNORMAL LOW (ref 36.0–46.0)
Hemoglobin: 9 g/dL — ABNORMAL LOW (ref 12.0–15.0)
MCH: 29.7 pg (ref 26.0–34.0)
MCHC: 28.3 g/dL — ABNORMAL LOW (ref 30.0–36.0)
MCV: 105 fL — ABNORMAL HIGH (ref 80.0–100.0)
Platelets: 93 10*3/uL — ABNORMAL LOW (ref 150–400)
RBC: 3.03 MIL/uL — ABNORMAL LOW (ref 3.87–5.11)
RDW: 15.9 % — ABNORMAL HIGH (ref 11.5–15.5)
WBC: 8.2 10*3/uL (ref 4.0–10.5)
nRBC: 2.6 % — ABNORMAL HIGH (ref 0.0–0.2)

## 2022-04-06 LAB — COMPREHENSIVE METABOLIC PANEL
ALT: 20 U/L (ref 0–44)
AST: 24 U/L (ref 15–41)
Albumin: 3 g/dL — ABNORMAL LOW (ref 3.5–5.0)
Alkaline Phosphatase: 111 U/L (ref 38–126)
Anion gap: 7 (ref 5–15)
BUN: 33 mg/dL — ABNORMAL HIGH (ref 6–20)
CO2: 31 mmol/L (ref 22–32)
Calcium: 8.3 mg/dL — ABNORMAL LOW (ref 8.9–10.3)
Chloride: 101 mmol/L (ref 98–111)
Creatinine, Ser: 3.55 mg/dL — ABNORMAL HIGH (ref 0.44–1.00)
GFR, Estimated: 16 mL/min — ABNORMAL LOW (ref >60)
Glucose, Bld: 170 mg/dL — ABNORMAL HIGH (ref 70–99)
Potassium: 4.8 mmol/L (ref 3.5–5.1)
Sodium: 139 mmol/L (ref 135–145)
Total Bilirubin: 0.9 mg/dL (ref 0.3–1.2)
Total Protein: 6.2 g/dL — ABNORMAL LOW (ref 6.5–8.1)

## 2022-04-06 LAB — PROCALCITONIN: Procalcitonin: 0.67 ng/mL

## 2022-04-06 LAB — LACTIC ACID, PLASMA: Lactic Acid, Venous: 1.1 mmol/L (ref 0.5–1.9)

## 2022-04-06 LAB — GLUCOSE, CAPILLARY
Glucose-Capillary: 115 mg/dL — ABNORMAL HIGH (ref 70–99)
Glucose-Capillary: 159 mg/dL — ABNORMAL HIGH (ref 70–99)
Glucose-Capillary: 164 mg/dL — ABNORMAL HIGH (ref 70–99)
Glucose-Capillary: 165 mg/dL — ABNORMAL HIGH (ref 70–99)

## 2022-04-06 LAB — MAGNESIUM: Magnesium: 2.3 mg/dL (ref 1.7–2.4)

## 2022-04-06 LAB — CORTISOL: Cortisol, Plasma: 26.6 ug/dL

## 2022-04-06 LAB — HIV ANTIBODY (ROUTINE TESTING W REFLEX): HIV Screen 4th Generation wRfx: NONREACTIVE

## 2022-04-06 LAB — PHOSPHORUS: Phosphorus: 7.1 mg/dL — ABNORMAL HIGH (ref 2.5–4.6)

## 2022-04-06 LAB — MRSA NEXT GEN BY PCR, NASAL: MRSA by PCR Next Gen: NOT DETECTED

## 2022-04-06 MED ORDER — IPRATROPIUM-ALBUTEROL 0.5-2.5 (3) MG/3ML IN SOLN
9.0000 mL | Freq: Once | RESPIRATORY_TRACT | Status: AC
Start: 2022-04-06 — End: 2022-04-06
  Administered 2022-04-06: 9 mL via RESPIRATORY_TRACT
  Filled 2022-04-06: qty 9

## 2022-04-06 MED ORDER — HEPARIN SODIUM (PORCINE) 1000 UNIT/ML DIALYSIS: 1000.0000 [IU] | INTRAMUSCULAR | Status: DC | PRN

## 2022-04-06 MED ORDER — PRISMASOL BGK 4/2.5 32-4-2.5 MEQ/L REPLACEMENT SOLN
Status: DC
  Filled 2022-04-06 (×2): qty 5000

## 2022-04-06 MED ORDER — GLYCOPYRROLATE 1 MG PO TABS: 1.0000 mg | ORAL_TABLET | ORAL | Status: DC | PRN

## 2022-04-06 MED ORDER — DEXTROSE 5 % IV SOLN
INTRAVENOUS | Status: DC
Start: 2022-04-06 — End: 2022-04-07

## 2022-04-06 MED ORDER — ACETAMINOPHEN 325 MG PO TABS: 650.0000 mg | ORAL_TABLET | Freq: Four times a day (QID) | ORAL | Status: DC | PRN

## 2022-04-06 MED ORDER — DIPHENHYDRAMINE HCL 50 MG/ML IJ SOLN: 25.0000 mg | INTRAMUSCULAR | Status: DC | PRN

## 2022-04-06 MED ORDER — IPRATROPIUM-ALBUTEROL 0.5-2.5 (3) MG/3ML IN SOLN: 3.0000 mL | RESPIRATORY_TRACT | Status: DC | PRN

## 2022-04-06 MED ORDER — LORAZEPAM 2 MG/ML IJ SOLN
2.0000 mg | INTRAMUSCULAR | Status: DC | PRN
  Administered 2022-04-07: 4 mg via INTRAVENOUS
  Filled 2022-04-06: qty 2

## 2022-04-06 MED ORDER — GLYCOPYRROLATE 0.2 MG/ML IJ SOLN
0.2000 mg | INTRAMUSCULAR | Status: DC | PRN
  Filled 2022-04-06: qty 1

## 2022-04-06 MED ORDER — SODIUM CHLORIDE 0.9 % IV SOLN
3.0000 g | Freq: Four times a day (QID) | INTRAVENOUS | Status: DC
  Filled 2022-04-06: qty 8

## 2022-04-06 MED ORDER — ACETAMINOPHEN 650 MG RE SUPP: 650.0000 mg | Freq: Four times a day (QID) | RECTAL | Status: DC | PRN

## 2022-04-06 MED ORDER — ACETAMINOPHEN 650 MG RE SUPP
650.0000 mg | Freq: Four times a day (QID) | RECTAL | Status: DC | PRN
  Administered 2022-04-06: 650 mg via RECTAL
  Filled 2022-04-06: qty 1

## 2022-04-06 MED ORDER — POLYVINYL ALCOHOL 1.4 % OP SOLN
1.0000 [drp] | Freq: Four times a day (QID) | OPHTHALMIC | Status: DC | PRN
Start: 2022-04-06 — End: 2022-04-07

## 2022-04-06 MED ORDER — SODIUM CHLORIDE 0.9% FLUSH: 10.0000 mL | INTRAVENOUS | Status: DC | PRN

## 2022-04-06 MED ORDER — FENTANYL CITRATE PF 50 MCG/ML IJ SOSY
25.0000 ug | PREFILLED_SYRINGE | INTRAMUSCULAR | Status: DC | PRN
  Administered 2022-04-06 (×2): 50 ug via INTRAVENOUS
  Filled 2022-04-06 (×2): qty 1

## 2022-04-06 MED ORDER — CHLORHEXIDINE GLUCONATE CLOTH 2 % EX PADS
6.0000 | MEDICATED_PAD | Freq: Every day | CUTANEOUS | Status: DC
  Administered 2022-04-06: 6 via TOPICAL

## 2022-04-06 MED ORDER — SODIUM CHLORIDE 0.9 % IV SOLN: 3.0000 g | INTRAVENOUS | Status: DC

## 2022-04-06 MED ORDER — GLYCOPYRROLATE 0.2 MG/ML IJ SOLN
0.2000 mg | INTRAMUSCULAR | Status: DC | PRN
  Administered 2022-04-06: 0.2 mg via INTRAVENOUS

## 2022-04-06 MED ORDER — SODIUM CHLORIDE 0.9% FLUSH
10.0000 mL | Freq: Two times a day (BID) | INTRAVENOUS | Status: DC
  Administered 2022-04-06: 10 mL

## 2022-04-06 MED ORDER — PRISMASOL BGK 4/2.5 32-4-2.5 MEQ/L EC SOLN: Status: DC

## 2022-04-06 NOTE — Progress Notes (Signed)
   04/06/22 1515  Clinical Encounter Type  Visited With Family  Visit Type Initial;Spiritual support;Social support  Referral From Other (Comment)  Consult/Referral To Chaplain  Spiritual Encounters  Spiritual Needs Emotional;Grief support   Daryel November attended to family to offer her support following family mtg with physician redefining goals to comfort care.  Chaplain Burris spoke with pt's mother who named her faith and gratitude for years with her daughter as being a gift, and she lifted this along with lament in her prayer; asked for my ongoing prayers for their family. Chaplain B also offered hospitality; no further needs expressed at this time.  Chaplain B will advise on-call chaplain tonight and will continue to f/u to assess ongoing needs.

## 2022-04-06 NOTE — Progress Notes (Signed)
CRRT Started with no complications. Continue to assess.

## 2022-04-06 NOTE — Progress Notes (Signed)
[facility] for Electrolyte Monitoring and Replacement   Recent Labs: Potassium (mmol/L)  Date Value  04/06/2022 4.8  03/31/2012 4.4   Magnesium (mg/dL)  Date Value  04/06/2022 2.3   Calcium (mg/dL)  Date Value  04/06/2022 8.3 (L)   Calcium, Total (mg/dL)  Date Value  03/31/2012 9.0   Albumin (g/dL)  Date Value  04/06/2022 3.0 (L)   Phosphorus (mg/dL)  Date Value  04/06/2022 7.1 (H)   Sodium (mmol/L)  Date Value  04/06/2022 139  03/31/2012 136   Corrected Ca: 9.1 mg/dL  Assessment: 36 y.o. female w/ PMH of stage IV cancer, sarcoidosis of the lungs, status post bilateral lung transplant 2007, and again in 2013, breast cancer status post double mastectomy, colon cancer, status postchemotherapy which appeared to have stopped at the end of last year, presents to the emergency department for unresponsiveness. Pharmacy is asked to follow and replace electrolytes while in CCU  Goal of Therapy:  Electrolytes WNL  Plan:  No electrolyte replacement warranted for today BID renal function panel while on CRRT per protocol  Dallie Piles ,PharmD Clinical Pharmacist 04/06/2022 6:51 AM

## 2022-04-06 NOTE — IPAL (Signed)
  Interdisciplinary Goals of Care Family Meeting   Date carried out: 04/06/2022  Location of the meeting: Bedside  Member's involved: Physician, Bedside Registered Nurse, and Family Member or next of kin  Durable Power of Attorney or acting medical decision maker: Husband Tyrone    Discussion: We discussed goals of care for Hannah Wood .  We discussed the fact that she has multi-organ failure in the face of numerous chronic, end stage illnesses that are irreversible.  Tyrone feels that she is suffering too much and now wishes to transition to full comfort measures.  He understands that this will entail stopping CRRT, vasopressors, all medicines except those that are focused on her comfort.  He requests that she still use oxygen.    Code status: Full DNR  Disposition: In-patient comfort care  Time spent for the meeting: 20 minutes    Roselie Awkward, MD  04/06/2022, 3:05 PM

## 2022-04-06 NOTE — Progress Notes (Signed)
   04/06/22 1000  Clinical Encounter Type  Visited With Patient and family together  Visit Type Initial;Critical Care  Referral From Nurse  Consult/Referral To Chaplain  Spiritual Encounters  Spiritual Needs Prayer;Grief support   Chaplain received call from ICU to provide support to husband of patient who is critically ill and to pray with patient. Patient alert but unable to communicate at time of visit.

## 2022-04-06 NOTE — H&P (Addendum)
NAME:  Hannah Wood, MRN:  [id number], DOB:  01-Mar-1986, LOS: 1 ADMISSION DATE:  04/04/2022, CONSULTATION DATE:  04/06/22 REFERRING MD:  Dr. Kerman Passey, CHIEF COMPLAINT:  Unresponsive   History of Present Illness:  36 year old female presenting to [facility] ED from home via EMS on 04/19/2022 for evaluation of altered mental status and hypotension.  History provided by patient's husband who is bedside as patient is too altered to participate in interview.    She has a past medical history of sarcoid, underwent bilateral lung transplant initially in [id number] which was complicated by CLAD.  She then underwent a second bilateral lung transplant in 2013.  There has been consideration for a third lung transplant however this has been complicated by her oncologic history.  She was found to have R RCC status post cryoablation in 2015 and breast cancer status post bilateral simple mastectomies in 2016 with subsequent local recurrence and wide reexcision in 2019.  More recently she was found to have colonic polyposis after being evaluated for intussusception and is now status post partial right hemicolectomy 2022 with primary anastomosis and pathology demonstrating neuroendocrine cancer.  Recently focal metabolic activity above the liver associated with a 0.8 cm hypoattenuating right hepatic lobe lesion is concerning for hepatic metastatic disease. She completed 4 cycles of carboplatin/etoposide.  She is end-stage renal disease on IHD, with her last treatment completed on Saturday, 04/04/2022.  She is on chronic O2 outpatient 2 to 4 L nasal cannula, however her husband reports on Sunday she was requiring 6 L nasal cannula.  Current baseline for the patient per her husband if she sleeps and is minimally responsive for 16 hours out of the day, then she is able to get up and interact with her family with some p.o. intake.  Also around 6 weeks ago she was given an estimated 3 months to live.   Patient in her normal state of  health but the evening of 04/29/2022 she appeared more lethargic than usual, husband took her blood pressure at home which was 60/40 prompting him to call EMS. ED course: Per ED documentation patient somewhat alert on arrival but with incomprehensible speech.  She quickly became unresponsive, with agonal appearing breathing and was placed on BiPAP for respiratory support.  EDP spoke with Dr. Danelle Earthly of [facility] transplant team, they confirmed there is nothing really additional to add at this point but the main team at [facility] will be where she is being admitted to our hospital. She arrives at the ED as a DNR/DNI, but has been confirmed he would like all other escalations in care up to that point.  Patient hypotensive but due to respiratory status and chest x-ray showing pulmonary edema, very conservative IV fluid resuscitation.  Levophed drip initiated and PCCM consulted for admission. Medications given: 0.2 mg IV atropine, Levophed drip initiated, 500 mL IV bolus, 0.5 mg Ativan, 9 mL DuoNeb, 150 meq sodium bicarb Initial Vitals: 18, 60, 90/61 and SPO2 100% on nonrebreather Significant labs: (Labs/ Imaging personally reviewed) I, Domingo Pulse [place], AGACNP-BC, personally viewed and interpreted this ECG. EKG Interpretation: Date: 04/20/2022, EKG Time: 1945, Rate: 62, Rhythm: NSR, QRS Axis: Normal, Intervals: Prolonged QTc, ST/T Wave abnormalities: None, Narrative Interpretation: NSR with prolonged QTc Chemistry: Na+: 135, K+: 5, BUN/Cr.:  26/3.54, Serum CO2/ AG: 31/3 Hematology: WBC: 7.6, Hgb: 9.2, plt: 99 Lactic/ PCT: Pending/0.45, COVID-19 & Influenza A/B: Negative ABG: <6.95/150/61/incalculable CXR 04/11/2022: Cardiomegaly with vascular congestion and probable pleural effusions.  Fairly extensive bilateral airspace disease  PCCM  consulted for admission due to acute hypoxic respiratory failure requiring BiPAP and circulatory shock on vasopressor support.  Pertinent  Medical History  Breast cancer s/p  bilateral mastectomy Hypertension Type 2 diabetes mellitus ESRD on IHD (Tu, Th, Sa) Sarcoidosis of the lung s/p bilateral lung transplant x 2 Renal cell carcinoma Chronic anemia Colon Neuroendocrine cancer s/p resection Significant Hospital Events: Including procedures, antibiotic start and stop dates in addition to other pertinent events   04/06/2022: Admit to ICU with acute on chronic hypoxic respiratory failure requiring BiPAP support and circulatory shock requiring vasopressor support  Interim History / Subjective:  Patient with a RASS of -5 on BiPAP.  Significant secretions pooling in BiPAP mask-suction provided. Discussed with husband that under different circumstances CT head would be performed, but currently very concerned this might cause her to cardiac arrest.  He is understanding of current situation and grave prognosis.  He would like to continue escalating care to support her as much as possible while continuing her DNR/DNI status. Plan of care discussed in detail, all questions and concerns answered at this time  Objective   Blood pressure (!) 149/84, pulse 74, resp. rate 20, height '5\' 3"'$  (1.6 m), weight 36.3 kg, SpO2 100 %.    FiO2 (%):  [60 %-100 %] 60 %  No intake or output data in the 24 hours ending 04/06/22 0022 Filed Weights   04/24/2022 1951  Weight: 36.3 kg    Examination: General: Adult female, critically ill, lying in bed, unresponsive NAD HEENT: MM pink/moist, anicteric, atraumatic, neck supple Neuro: RASS -5, unresponsive and unable to follow commands, PERRL +3 CV: s1s2 RRR, NSR-SB on monitor, no r/m/g Pulm: Agonal, non labored on BiPAP at 100% FiO2, breath sounds coarse/diminished-BUL & diminished-BLL GI: soft, rounded, bs x 4 Skin: Limited exam- no rashes/lesions noted Extremities: warm/dry, pulses + 2 R/P, trace edema noted BLE  Resolved Hospital Problem list     Assessment & Plan:  Acute on chronic hypoxic/ Hypercapnic Respiratory Failure secondary  to acute encephalopathy, suspected aspiration, pulmonary edema in the setting of 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 - Continue BIPAP overnight, wean FiO2 as tolerated, adjusted to AVAPS mode as patient was only receiving 170 tidal volume due to lack of effort - Follow-up VBG - Supplemental O2 to maintain SpO2 > 90% - Intermittent chest x-ray & ABG PRN - Ensure adequate pulmonary hygiene  - trend PCT -Initiate aspiration Pna coverage: unasyn - bronchodilators PRN -Tacrolimus level pending  Circulatory shock Prolonged QTc No additional IV fluid given tenuous respiratory status -Sodium bicarbonate supplementation - SDS: Solu-Medrol 20 mg twice daily -Continue Levophed drip, add vasopressin, wean as tolerated to maintain MAP > 65 -Continuous cardiac monitoring  ESRD on IHD Discussed with Dr. Holley Raring regarding initiation of CRRT.  Patient not a candidate for emergent CRRT, will reevaluate 04/06/2022 - Strict I/O's: alert provider if UOP < 0.5 mL/kg/hr - Daily BMP, replace electrolytes PRN - Avoid nephrotoxic agents as able, ensure adequate renal perfusion - Nephrology following, appreciate input   Acute encephalopathy multifocal in the setting of respiratory acidosis, circulatory shock and active cancer requiring chemotherapy - Minimize sedatives as tolerated - Supportive care - Palliative consult  Best Practice (right click and "Reselect all SmartList Selections" daily)  Diet/type: NPO DVT prophylaxis: prophylactic heparin  GI prophylaxis: PPI Lines: yes and it is still needed, port PTA Foley:  N/A Code Status:  DNR Last date of multidisciplinary goals of care discussion [04/06/2022]  Labs   CBC: Recent Labs  Lab 04/11/2022 2005  WBC 7.6  HGB 9.2*  HCT 34.1*  MCV 108.3*  PLT 99*    Basic Metabolic Panel: Recent Labs  Lab 04/04/22 1100 04/06/2022 2005  NA  --  135  K 4.4 5.0  CL  --  101  CO2  --  31  GLUCOSE  --  120*  BUN  --  26*  CREATININE  --   3.54*  CALCIUM  --  8.9  MG  --  2.8*  PHOS  --  7.8*   GFR: Estimated Creatinine Clearance: 12.5 mL/min (A) (by C-G formula based on SCr of 3.54 mg/dL (H)). Recent Labs  Lab 04/22/2022 2005  PROCALCITON 0.45  WBC 7.6    Liver Function Tests: Recent Labs  Lab 04/11/2022 2005  AST 24  ALT 19  ALKPHOS 119  BILITOT 0.6  PROT 6.9  ALBUMIN 3.3*   No results for input(s): LIPASE, AMYLASE in the last 168 hours. No results for input(s): AMMONIA in the last 168 hours.  ABG    Component Value Date/Time   PHART <6.95 (LL) 04/23/2022 2050   PCO2ART PENDING 04/17/2022 2050   PO2ART 61 (L) 04/28/2022 2050   HCO3 PENDING 04/29/2022 2050   O2SAT 90.4 04/03/2022 2050     Coagulation Profile: No results for input(s): INR, PROTIME in the last 168 hours.  Cardiac Enzymes: No results for input(s): CKTOTAL, CKMB, CKMBINDEX, TROPONINI in the last 168 hours.  HbA1C: No results found for: HGBA1C  CBG: Recent Labs  Lab 04/06/22 0005  GLUCAP 115*    Review of Systems:   UTA patient unresponsive and unable to participate in interview  Past Medical History:  She,  has a past medical history of Allergy, Cancer ([place]), Diabetes mellitus without complication ([place]), Hypertension, and Sarcoidosis of lung ([place]).   Surgical History:   Past Surgical History:  Procedure Laterality Date   BONE MARROW TRANSPLANT     LUNG TRANSPLANT, DOUBLE Bilateral 2007, 2012     Social History:   reports that she has never smoked. She has never used smokeless tobacco. She reports that she does not drink alcohol and does not use drugs.   Family History:  Her family history includes Diabetes in her maternal grandfather; Healthy in her mother.   Allergies Allergies  Allergen Reactions   Other Rash and Other (See Comments)    transplant Reacts to prograf   Metoclopramide Other (See Comments)    On high dose reglan 10 qid she had altered mental status, jittery, myoclonic jerks. Doing ok on lower  dose reglan 5 tid.   Sulfamethoxazole-Trimethoprim Nausea Only    High Doses of Bactrim   Oxaliplatin Itching   Trastuzumab Other (See Comments)    Migraines    Benzalkonium Chloride Rash    Patient Allergic to the Blue Bandages ([place])     Home Medications  Prior to Admission medications   Medication Sig Start Date End Date Taking? Authorizing Provider  albuterol (PROVENTIL) (2.5 MG/3ML) 0.083% nebulizer solution inhale contents of 1 vial in nebulizer every 6 hours if needed for wheezing 11/06/16  Yes [provider]  amLODipine (NORVASC) 5 MG tablet Take 2 tablets by mouth daily. 04/17/15  Yes [provider]  atovaquone (MEPRON) 750 MG/5ML suspension Take 1,500 mg by mouth daily with breakfast. 03/17/22  Yes [provider]  BREO ELLIPTA 100-25 MCG/ACT AEPB Inhale 1 puff into the lungs daily. 01/13/22  Yes [provider]  Calcium Carbonate-Vitamin D 600-400 MG-UNIT tablet Take  1 tablet twice a day. 12/18/14  Yes [provider]  cetirizine (ZYRTEC) 10 MG tablet Take 10 mg by mouth daily. 03/25/22  Yes [provider]  dapsone 100 MG tablet Take 100 mg by mouth daily. 11/09/16  Yes [provider]  dexamethasone (DECADRON) 4 MG tablet Take 8 mg by mouth as directed. 4 MG tablet Take '8mg'$  (2 x '4mg'$  tablets) by mouth in the morning for 2 days after oxaliplatin chemotherapy, 12/01/21  Yes [provider]  dronabinol (MARINOL) 2.5 MG capsule Take 2.5 mg by mouth 2 (two) times daily as needed. 03/16/22  Yes [provider]  fluticasone (FLONASE) 50 MCG/ACT nasal spray Place 2 sprays into both nostrils daily. 03/25/22  Yes [provider]  HUMALOG 100 UNIT/ML injection Inject into the skin as directed. Take sliding scale as directed. Call lung transplant team if you do not have a sliding scale to guide insulin administration. 10/10/21  Yes [provider]  hydrOXYzine (ATARAX) 25 MG tablet Take 25 mg by mouth  every 8 (eight) hours as needed.   Yes [provider]  lidocaine-prilocaine (EMLA) cream Apply topically. 06/01/16  Yes [provider]  LORazepam (ATIVAN) 0.5 MG tablet Take 0.5 mg by mouth every 8 (eight) hours as needed for anxiety.   Yes [provider]  magnesium oxide (MAG-OX) 400 (240 Mg) MG tablet Take 1 tablet by mouth 2 (two) times daily. 02/24/22  Yes [provider]  metolazone (ZAROXOLYN) 5 MG tablet Take 5 mg by mouth daily. 03/04/22  Yes [provider]  metoprolol succinate (TOPROL-XL) 25 MG 24 hr tablet Take 50 mg by mouth daily. 10/18/21  Yes [provider]  montelukast (SINGULAIR) 10 MG tablet Take 10 mg by mouth daily. 01/14/22  Yes [provider]  OLANZapine zydis (ZYPREXA) 10 MG disintegrating tablet Take 10 mg by mouth at bedtime. 03/22/22  Yes [provider]  omeprazole (PRILOSEC) 20 MG capsule Take 20 mg by mouth daily.   Yes [provider]  ondansetron (ZOFRAN) 4 MG tablet Take 4 mg by mouth every 8 (eight) hours as needed for nausea or vomiting.   Yes [provider]  predniSONE (DELTASONE) 5 MG tablet Take 1 tablet by mouth daily. 04/17/15  Yes [provider]  prochlorperazine (COMPAZINE) 10 MG tablet Take 10 mg by mouth every 6 (six) hours. 12/17/21  Yes [provider]  rizatriptan (MAXALT-MLT) 5 MG disintegrating tablet Take 5 mg by mouth as needed. 10/23/21  Yes [provider]  rosuvastatin (CRESTOR) 5 MG tablet Take 5 mg by mouth daily. 10/27/21  Yes [provider]  tacrolimus (PROGRAF) 1 MG capsule Take 3 mg by mouth 2 (two) times daily. 01/10/22  Yes [provider]  thiamine 100 MG tablet Take 100 mg by mouth daily. 10/08/21 10/08/22 Yes [provider]  torsemide (DEMADEX) 100 MG tablet Take 100 mg by mouth 2 (two) times daily. 03/04/22  Yes [provider]  Vitamin D, Ergocalciferol, (DRISDOL) 1.25 MG (50000 UNIT)  CAPS capsule Take 50,000 Units by mouth every 7 (seven) days.   Yes [provider]     Critical care time: 67 minutes      Venetia Night, AGACNP-BC Acute Care Nurse Practitioner [facility]   [phone] / [phone] Please see Amion for pager details.

## 2022-04-06 NOTE — Progress Notes (Signed)
PHARMACY NOTE:  ANTIMICROBIAL RENAL DOSAGE ADJUSTMENT  Current antimicrobial regimen includes a mismatch between antimicrobial dosage and estimated renal function.  As per policy approved by the Pharmacy & Therapeutics and Medical Executive Committees, the antimicrobial dosage will be adjusted accordingly.  Current antimicrobial dosage:  Unasyn 3 grams IV every 24 hours  Indication: PNA  Renal Function:  Estimated Creatinine Clearance: 16.3 mL/min (A) (by C-G formula based on SCr of 3.55 mg/dL (H)). '[]'$      On intermittent HD, scheduled: '[x]'$      On CRRT    Antimicrobial dosage has been changed to:  Unasyn 3 grams IV every 6 hours  Thank you for allowing pharmacy to be a part of this patient's care.  Dallie Piles, [facility] 04/06/2022 10:58 AM

## 2022-04-06 NOTE — Consult Note (Incomplete)
[facility]  CONSULT NOTE    Date: 04/06/2022                  Patient Name:  Hannah Wood  MRN: [id number]  DOB: Mar 26, 1986  Age / Sex: 36 y.o., female         PCP: Mikey College, MD                 Service Requesting Consult: Critical Care                 Reason for Consult: CRRT vs iHD            History of Present Illness: Hannah Wood is a 36 y.o.  female with , who was admitted to [facility] on 04/02/2022 for   Medications: Outpatient medications: Facility-Administered Medications Prior to Admission  Medication Dose Route Frequency Provider Last Rate Last Admin   aspirin chewable tablet 324 mg  324 mg Oral Once Olin Hauser, DO       Medications Prior to Admission  Medication Sig Dispense Refill Last Dose   albuterol (PROVENTIL) (2.5 MG/3ML) 0.083% nebulizer solution inhale contents of 1 vial in nebulizer every 6 hours if needed for wheezing  1 prn at prn   amLODipine (NORVASC) 5 MG tablet Take 2 tablets by mouth daily.  0 04/18/2022 at 0800   atovaquone (MEPRON) 750 MG/5ML suspension Take 1,500 mg by mouth daily with breakfast.   04/29/2022 at 0800   BREO ELLIPTA 100-25 MCG/ACT AEPB Inhale 1 puff into the lungs daily.   04/04/2022 at 0800   Calcium Carbonate-Vitamin D 600-400 MG-UNIT tablet Take 1 tablet twice a day.   04/12/2022 at 0800   cetirizine (ZYRTEC) 10 MG tablet Take 10 mg by mouth daily.   05/01/2022 at 0800   dapsone 100 MG tablet Take 100 mg by mouth daily.  0 04/24/2022 at 0800   dexamethasone (DECADRON) 4 MG tablet Take 8 mg by mouth as directed. 4 MG tablet Take '8mg'$  (2 x '4mg'$  tablets) by mouth in the morning for 2 days after oxaliplatin chemotherapy,   as directed   dronabinol (MARINOL) 2.5 MG capsule Take 2.5 mg by mouth 2 (two) times daily as needed.   prn at prn   fluticasone (FLONASE) 50 MCG/ACT nasal spray Place 2 sprays into both nostrils daily.   04/18/2022   HUMALOG 100 UNIT/ML injection Inject into the skin as  directed. Take sliding scale as directed. Call lung transplant team if you do not have a sliding scale to guide insulin administration.   Past Week   hydrOXYzine (ATARAX) 25 MG tablet Take 25 mg by mouth every 8 (eight) hours as needed.   prn at prn   lidocaine-prilocaine (EMLA) cream Apply topically.   prn at prn   LORazepam (ATIVAN) 0.5 MG tablet Take 0.5 mg by mouth every 8 (eight) hours as needed for anxiety.   prn at prn   magnesium oxide (MAG-OX) 400 (240 Mg) MG tablet Take 1 tablet by mouth 2 (two) times daily.   04/30/2022 at 1200   metolazone (ZAROXOLYN) 5 MG tablet Take 5 mg by mouth daily.   04/30/2022 at 0800   metoprolol succinate (TOPROL-XL) 25 MG 24 hr tablet Take 50 mg by mouth daily.   04/12/2022 at 0800   montelukast (SINGULAIR) 10 MG tablet Take 10 mg by mouth daily.   04/04/2022 at 2000   OLANZapine zydis (ZYPREXA) 10 MG disintegrating tablet Take 10  mg by mouth at bedtime.   04/04/2022 at 2000   omeprazole (PRILOSEC) 20 MG capsule Take 20 mg by mouth daily.   04/14/2022   ondansetron (ZOFRAN) 4 MG tablet Take 4 mg by mouth every 8 (eight) hours as needed for nausea or vomiting.   prn at prn   predniSONE (DELTASONE) 5 MG tablet Take 1 tablet by mouth daily.  0 04/17/2022 at 0800   prochlorperazine (COMPAZINE) 10 MG tablet Take 10 mg by mouth every 6 (six) hours.   prn at prn   rizatriptan (MAXALT-MLT) 5 MG disintegrating tablet Take 5 mg by mouth as needed.   prn at prn   rosuvastatin (CRESTOR) 5 MG tablet Take 5 mg by mouth daily.   04/04/2022 at 2000   tacrolimus (PROGRAF) 1 MG capsule Take 3 mg by mouth 2 (two) times daily.   04/28/2022 at 0800   thiamine 100 MG tablet Take 100 mg by mouth daily.   04/27/2022 at 0800   torsemide (DEMADEX) 100 MG tablet Take 100 mg by mouth 2 (two) times daily.   04/19/2022 at 0800   Vitamin D, Ergocalciferol, (DRISDOL) 1.25 MG (50000 UNIT) CAPS capsule Take 50,000 Units by mouth every 7 (seven) days.   Past Week    Current medications: Current  Facility-Administered Medications  Medication Dose Route Frequency Provider Last Rate Last Admin    prismasol BGK 4/2.5 infusion   CRRT Continuous Lateef, Munsoor, MD 500 mL/hr at 04/06/22 1434 New Bag at 04/06/22 1434    prismasol BGK 4/2.5 infusion   CRRT Continuous Lateef, Munsoor, MD 500 mL/hr at 04/06/22 1147 New Bag at 04/06/22 1147   acetaminophen (TYLENOL) suppository 650 mg  650 mg Rectal Q6H PRN Juanito Doom, MD   650 mg at 04/06/22 1400   Ampicillin-Sulbactam (UNASYN) 3 g in sodium chloride 0.9 % 100 mL IVPB  3 g Intravenous Q6H Dallie Piles, RPH       Chlorhexidine Gluconate Cloth 2 % PADS 6 each  6 each Topical Daily Ottie Glazier, MD   6 each at 04/06/22 0936   docusate sodium (COLACE) capsule 100 mg  100 mg Oral BID PRN Rust-Chester, Toribio Harbour L, NP       heparin injection 1,000-6,000 Units  1,000-6,000 Units CRRT PRN Lateef, Munsoor, MD       heparin injection 5,000 Units  5,000 Units Subcutaneous Q8H Rust-Chester, Britton L, NP   5,000 Units at 04/06/22 1334   ipratropium-albuterol (DUONEB) 0.5-2.5 (3) MG/3ML nebulizer solution 3 mL  3 mL Nebulization Q4H PRN Rust-Chester, Toribio Harbour L, NP       methylPREDNISolone sodium succinate (SOLU-MEDROL) 40 mg/mL injection 20 mg  20 mg Intravenous Q12H Rust-Chester, Britton L, NP   20 mg at 04/06/22 1150   norepinephrine (LEVOPHED) 16 mg in 276m premix infusion  0-40 mcg/min Intravenous Continuous Rust-Chester, Britton L, NP 9.38 mL/hr at 04/06/22 1400 10 mcg/min at 04/06/22 1400   pantoprazole (PROTONIX) injection 40 mg  40 mg Intravenous QHS Rust-Chester, Britton L, NP   40 mg at 04/06/22 0032   polyethylene glycol (MIRALAX / GLYCOLAX) packet 17 g  17 g Oral Daily PRN Rust-Chester, BHuel Cote NP       prismasol BGK 4/2.5 infusion   CRRT Continuous Lateef, Munsoor, MD 2,000 mL/hr at 04/06/22 1147 New Bag at 04/06/22 1147   sodium chloride flush (NS) 0.9 % injection 10-40 mL  10-40 mL Intracatheter Q12H AOttie Glazier MD   10 mL at  04/06/22 0936   sodium  chloride flush (NS) 0.9 % injection 10-40 mL  10-40 mL Intracatheter PRN Ottie Glazier, MD       vasopressin (PITRESSIN) 20 Units in sodium chloride 0.9 % 100 mL infusion-*FOR SHOCK*  0-0.04 Units/min Intravenous Continuous Rust-Chester, Huel Cote, NP   Stopped at 04/06/22 0848      Allergies: Allergies  Allergen Reactions   Other Rash and Other (See Comments)    transplant Reacts to prograf   Metoclopramide Other (See Comments)    On high dose reglan 10 qid she had altered mental status, jittery, myoclonic jerks. Doing ok on lower dose reglan 5 tid.   Sulfamethoxazole-Trimethoprim Nausea Only    High Doses of Bactrim   Oxaliplatin Itching   Trastuzumab Other (See Comments)    Migraines    Benzalkonium Chloride Rash    Patient Allergic to the Blue Bandages ([place])      Past Medical History: Past Medical History:  Diagnosis Date   Allergy    Cancer ([place])    Diabetes mellitus without complication ([place])    Hypertension    Sarcoidosis of lung ([place])      Past Surgical History: Past Surgical History:  Procedure Laterality Date   BONE MARROW TRANSPLANT     LUNG TRANSPLANT, DOUBLE Bilateral 2007, 2012     Family History: Family History  Problem Relation Age of Onset   Healthy Mother    Diabetes Maternal Grandfather      Social History: Social History   Socioeconomic History   Marital status: Married    Spouse name: Not on file   Number of children: Not on file   Years of education: Not on file   Highest education level: Not on file  Occupational History   Not on file  Tobacco Use   Smoking status: Never   Smokeless tobacco: Never  Substance and Sexual Activity   Alcohol use: No    Alcohol/week: 0.0 standard drinks   Drug use: No   Sexual activity: Yes    Birth control/protection: None  Other Topics Concern   Not on file  Social History Narrative   Not on file   Social Determinants of Health   Financial Resource Strain: Not  on file  Food Insecurity: Not on file  Transportation Needs: Not on file  Physical Activity: Not on file  Stress: Not on file  Social Connections: Not on file  Intimate Partner Violence: Not on file     Review of Systems: ROS  Vital Signs: Blood pressure (!) 76/68, pulse 69, temperature (!) 93.4 F (34.1 C), resp. rate 14, height '5\' 3"'$  (1.6 m), weight 47.6 kg, SpO2 100 %.  Weight trends: Filed Weights   04/19/2022 1951 04/06/22 0030  Weight: 36.3 kg 47.6 kg    Physical Exam: General: NAD,   Head: Normocephalic, atraumatic. Moist oral mucosal membranes  Eyes: Anicteric, PERRL  Neck: Supple, trachea midline  Lungs:  Clear to auscultation  Heart: Regular rate and rhythm  Abdomen:  Soft, nontender,   Extremities:  *** peripheral edema.  Neurologic: Nonfocal, moving all four extremities  Skin: No lesions  Access: ***     Lab results: Basic Metabolic Panel: Recent Labs  Lab 04/04/22 1100 04/19/2022 2005 04/06/22 0501  NA  --  135 139  K 4.4 5.0 4.8  CL  --  101 101  CO2  --  31 31  GLUCOSE  --  120* 170*  BUN  --  26* 33*  CREATININE  --  3.54* 3.55*  CALCIUM  --  8.9 8.3*  MG  --  2.8* 2.3  PHOS  --  7.8* 7.1*    Liver Function Tests: Recent Labs  Lab 04/22/2022 2005 04/06/22 0501  AST 24 24  ALT 19 20  ALKPHOS 119 111  BILITOT 0.6 0.9  PROT 6.9 6.2*  ALBUMIN 3.3* 3.0*   No results for input(s): LIPASE, AMYLASE in the last 168 hours. No results for input(s): AMMONIA in the last 168 hours.  CBC: Recent Labs  Lab 04/26/2022 2005 04/06/22 0501  WBC 7.6 8.2  HGB 9.2* 9.0*  HCT 34.1* 31.8*  MCV 108.3* 105.0*  PLT 99* 93*    Cardiac Enzymes: No results for input(s): CKTOTAL, CKMB, CKMBINDEX, TROPONINI in the last 168 hours.  BNP: Invalid input(s): POCBNP  CBG: Recent Labs  Lab 04/06/22 0005 04/06/22 0340 04/06/22 0731 04/06/22 1146  GLUCAP 115* 164* 165* 159*    Microbiology: Results for orders placed or performed during the hospital  encounter of 04/16/2022  MRSA Next Gen by PCR, Nasal     Status: None   Collection Time: 04/16/2022  2:58 AM   Specimen: Nasal Mucosa; Nasal Swab  Result Value Ref Range Status   MRSA by PCR Next Gen NOT DETECTED NOT DETECTED Final    Comment: (NOTE) The GeneXpert MRSA Assay (FDA approved for NASAL specimens only), is one component of a comprehensive MRSA colonization surveillance program. It is not intended to diagnose MRSA infection nor to guide or monitor treatment for MRSA infections. Test performance is not FDA approved in patients less than 58 years old. Performed at [facility], [address] [place], [place] [address]   SARS Coronavirus 2 by RT PCR (hospital order, performed in [facility] hospital lab) *cepheid single result test* Anterior Nasal Swab     Status: None   Collection Time: 04/12/2022  8:54 PM   Specimen: Anterior Nasal Swab  Result Value Ref Range Status   SARS Coronavirus 2 by RT PCR NEGATIVE NEGATIVE Final    Comment: (NOTE) SARS-CoV-2 target nucleic acids are NOT DETECTED.  The SARS-CoV-2 RNA is generally detectable in upper and lower respiratory specimens during the acute phase of infection. The lowest concentration of SARS-CoV-2 viral copies this assay can detect is 250 copies / mL. A negative result does not preclude SARS-CoV-2 infection and should not be used as the sole basis for treatment or other patient management decisions.  A negative result may occur with improper specimen collection / handling, submission of specimen other than nasopharyngeal swab, presence of viral mutation(s) within the areas targeted by this assay, and inadequate number of viral copies (<250 copies / mL). A negative result must be combined with clinical observations, patient history, and epidemiological information.  Fact Sheet for Patients:   https://www.patel.info/  Fact Sheet for Healthcare  Providers: https://hall.com/  This test is not yet approved or  cleared by the [place] FDA and has been authorized for detection and/or diagnosis of SARS-CoV-2 by FDA under an Emergency Use Authorization (EUA).  This EUA will remain in effect (meaning this test can be used) for the duration of the COVID-19 declaration under Section 564(b)(1) of the Act, 21 U.S.C. section 360bbb-3(b)(1), unless the authorization is terminated or revoked sooner.  Performed at [facility], [address] [place], [place] [address]     Coagulation Studies: No results for input(s): LABPROT, INR in the last 72 hours.  Urinalysis: No results for input(s): COLORURINE, LABSPEC, [place], [place], [place], [place], [place], [place],  UROBILINOGEN, NITRITE, LEUKOCYTESUR in the last 72 hours.  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 FINDINGS: Left-sided central venous port tip over the SVC. Right-sided central venous catheter tip over the cavoatrial region. Sternotomy wires. Cardiomegaly with vascular congestion. Fairly extensive bilateral airspace disease and probable pleural effusions. No pneumothorax. Clips in the left axilla and chest IMPRESSION: 1. Cardiomegaly with vascular congestion and probable pleural effusions 2. Fairly extensive bilateral airspace disease Electronically Signed   By: Donavan Foil M.D.   On: 04/09/2022 20:20     Assessment & Plan: Hannah Wood is a 36 y.o.  female with , who was admitted to [facility] on 04/23/2022 for     LOS: [place] 6/5/20232:37 PM

## 2022-04-06 NOTE — Progress Notes (Signed)
eLink Physician-Brief Progress Note Patient Name: Hannah Wood DOB: November 28, 1985 MRN: [id number]   Date of Service  04/06/2022  HPI/Events of Note  Brief new admit note:  36 yr old female with hx of ESRD with CC: loss of consciousness, admitted to ICU for aspiration pneumonia/AHRF-CHF.   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   DNR   Data: Reviewed Covid neg Anemia Cr > 3.3, K 5 Wbc normal 6.9/pco2 pending/61.  Still yet to get final reding.  Camera: On AVAPs 350/rate 20, P max 30, M min 9 Ve 8 Lit Sats 100%. 150/90. HR 76 Levophed to keep MAP > 65.    - husband want her to be DNR, ok with levophed and BiPAP-AVAPs. - Transplant team- does not have further treatment to add at this time. - received 500 ml bolus, on unasyn/nebs/steroids.  - sq heparin as VTE prophylaxis. CBG goals < 180.    eICU Interventions  - continue current care. Nothing to add from [place] .      Intervention Category Major Interventions: Respiratory failure - evaluation and management;Other:;Change in mental status - evaluation and management Evaluation Type: New Patient Evaluation  Elmer Sow 04/06/2022, 12:06 AM

## 2022-04-06 NOTE — Progress Notes (Signed)
Patients husband has removed warming blanket. Explained to Hannah Wood the importance of keeping warming blanket on due to her being hypothermic. Patient continuously kicking blanket off with her legs. He has placed blanket back on. Continue to assess.

## 2022-04-06 NOTE — Progress Notes (Signed)
Comfort care initiated. Fentanyl given, CRRT stopped and pressors. Husband, patients mother at bedside. [place] called  and with family. Continue to assess.

## 2022-04-06 NOTE — Progress Notes (Signed)
Patient transported to icu on bipap. Received verbal order in ER to place patient on AVAPS vt 350 r 20 60% pmax 30 pmin 12. Once in icu small adjustments to pressure settings to accommodate ventilation.

## 2022-04-06 NOTE — Progress Notes (Signed)
Ref [phone] Call [facility] with time of death. Continue to assess.

## 2022-04-06 NOTE — Progress Notes (Signed)
[facility]  ROUNDING NOTE   Subjective:   COLEY LITTLES  is a 36 y.o.  female with sarcoid with bilateral lung transplant in 2007 and 2013, breast cancer, colonic polyposis and neuroendocrine cancer, who was admitted to [facility] on 04/16/2022 for Acute respiratory failure ([place]) [J96.00] Unresponsiveness [R41.89]  Patient is known to our practice and has recently began outpatient dialysis treatments at [facility] on a Tu/Sat schedule, supervised by Dr Candiss Norse. Patient is seen and evaluated at the bedside in ICU. Husband at bedside as well. Patient on Bipap. Patient presented to ED with altered mental status and hypotension. Patient remains somnolent during this initial visit and her husband is able to answer questions. Confirms her recent start to dialysis. Chart review states patient sleeps most days but is able to interact with faily and get around. Appetite is poor. Patient was home and more lethargic than normal. When husband checked her BP, 60/40.   Labs on ED arrival show creatinine 3.54 with GFR 16, phosphorus 7.8, magnesium 2.8, Hgb 9.2. chest xray shows extensive airspace disease with probable pleural effusion  We have been consulted to evaluate need for dialysis vs CRRT.   Objective:  Vital signs in last 24 hours:  Temp:  [88.6 F (31.4 C)-98.9 F (37.2 C)] 93.4 F (34.1 C) (06/05 1400) Pulse Rate:  [53-89] 69 (06/05 1430) Resp:  [6-42] 14 (06/05 1430) BP: (59-149)/(27-100) 76/68 (06/05 1430) SpO2:  [15 %-100 %] 100 % (06/05 1430) FiO2 (%):  [60 %-100 %] 60 % (06/05 0449) Weight:  [36.3 kg-47.6 kg] 47.6 kg (06/05 0030)  Weight change:  Filed Weights   04/22/2022 1951 04/06/22 0030  Weight: 36.3 kg 47.6 kg    Intake/Output: I/O last 3 completed shifts: In: 350.7 [I.V.:250.7; IV Piggyback:100] Out: -    Intake/Output this shift:  Total I/O In: 78.6 [I.V.:78.6] Out: 151 [Other:151]  Physical Exam: General: NAD, resting quietly  Head: Normocephalic,  atraumatic.   Eyes: Anicteric  Lungs:  Rhonchi, Bipap  Heart: Regular rate and rhythm  Abdomen:  Soft, nontender  Extremities:  2+ peripheral edema.  Neurologic: Somnolent  Skin: No lesions  Access: Rt Permcath    Basic Metabolic Panel: Recent Labs  Lab 04/04/22 1100 04/04/2022 2005 04/06/22 0501  NA  --  135 139  K 4.4 5.0 4.8  CL  --  101 101  CO2  --  31 31  GLUCOSE  --  120* 170*  BUN  --  26* 33*  CREATININE  --  3.54* 3.55*  CALCIUM  --  8.9 8.3*  MG  --  2.8* 2.3  PHOS  --  7.8* 7.1*    Liver Function Tests: Recent Labs  Lab 04/14/2022 2005 04/06/22 0501  AST 24 24  ALT 19 20  ALKPHOS 119 111  BILITOT 0.6 0.9  PROT 6.9 6.2*  ALBUMIN 3.3* 3.0*   No results for input(s): LIPASE, AMYLASE in the last 168 hours. No results for input(s): AMMONIA in the last 168 hours.  CBC: Recent Labs  Lab 04/30/2022 2005 04/06/22 0501  WBC 7.6 8.2  HGB 9.2* 9.0*  HCT 34.1* 31.8*  MCV 108.3* 105.0*  PLT 99* 93*    Cardiac Enzymes: No results for input(s): CKTOTAL, CKMB, CKMBINDEX, TROPONINI in the last 168 hours.  BNP: Invalid input(s): POCBNP  CBG: Recent Labs  Lab 04/06/22 0005 04/06/22 0340 04/06/22 0731 04/06/22 1146  GLUCAP 115* 164* 165* 159*    Microbiology: Results for orders placed or performed during  the hospital encounter of 04/09/2022  MRSA Next Gen by PCR, Nasal     Status: None   Collection Time: 04/26/2022  2:58 AM   Specimen: Nasal Mucosa; Nasal Swab  Result Value Ref Range Status   MRSA by PCR Next Gen NOT DETECTED NOT DETECTED Final    Comment: (NOTE) The GeneXpert MRSA Assay (FDA approved for NASAL specimens only), is one component of a comprehensive MRSA colonization surveillance program. It is not intended to diagnose MRSA infection nor to guide or monitor treatment for MRSA infections. Test performance is not FDA approved in patients less than 76 years old. Performed at [facility], [address] [place], [place]  [address]   SARS Coronavirus 2 by RT PCR (hospital order, performed in [facility] hospital lab) *cepheid single result test* Anterior Nasal Swab     Status: None   Collection Time: 04/26/2022  8:54 PM   Specimen: Anterior Nasal Swab  Result Value Ref Range Status   SARS Coronavirus 2 by RT PCR NEGATIVE NEGATIVE Final    Comment: (NOTE) SARS-CoV-2 target nucleic acids are NOT DETECTED.  The SARS-CoV-2 RNA is generally detectable in upper and lower respiratory specimens during the acute phase of infection. The lowest concentration of SARS-CoV-2 viral copies this assay can detect is 250 copies / mL. A negative result does not preclude SARS-CoV-2 infection and should not be used as the sole basis for treatment or other patient management decisions.  A negative result may occur with improper specimen collection / handling, submission of specimen other than nasopharyngeal swab, presence of viral mutation(s) within the areas targeted by this assay, and inadequate number of viral copies (<250 copies / mL). A negative result must be combined with clinical observations, patient history, and epidemiological information.  Fact Sheet for Patients:   https://www.patel.info/  Fact Sheet for Healthcare Providers: https://hall.com/  This test is not yet approved or  cleared by the [place] FDA and has been authorized for detection and/or diagnosis of SARS-CoV-2 by FDA under an Emergency Use Authorization (EUA).  This EUA will remain in effect (meaning this test can be used) for the duration of the COVID-19 declaration under Section 564(b)(1) of the Act, 21 U.S.C. section 360bbb-3(b)(1), unless the authorization is terminated or revoked sooner.  Performed at [facility], [address] [place], [place] [address]     Coagulation Studies: No results for input(s): LABPROT, INR in the last 72 hours.  Urinalysis: No results for input(s):  COLORURINE, LABSPEC, PHURINE, GLUCOSEU, HGBUR, BILIRUBINUR, KETONESUR, PROTEINUR, UROBILINOGEN, NITRITE, LEUKOCYTESUR in the last 72 hours.  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 FINDINGS: Left-sided central venous port tip over the SVC. Right-sided central venous catheter tip over the cavoatrial region. Sternotomy wires. Cardiomegaly with vascular congestion. Fairly extensive bilateral airspace disease and probable pleural effusions. No pneumothorax. Clips in the left axilla and chest IMPRESSION: 1. Cardiomegaly with vascular congestion and probable pleural effusions 2. Fairly extensive bilateral airspace disease Electronically Signed   By: Donavan Foil M.D.   On: 04/24/2022 20:20     Medications:    dextrose      acetaminophen **OR** acetaminophen, diphenhydrAMINE, fentaNYL (SUBLIMAZE) injection, glycopyrrolate **OR** glycopyrrolate **OR** glycopyrrolate, LORazepam, polyvinyl alcohol  Assessment/ Plan:  Ms. MALA GIBBARD is a 36 y.o.  female with sarcoid  with bilateral lung transplant in 2007 and 2013, breast cancer, colonic polyposis and neuroendocrine cancer, who was admitted to [facility] on 04/23/2022 for Acute respiratory failure ([place]) [J96.00] Unresponsiveness [R41.89]  [facility]/TuSat/Rt permcath  Acute kidney injury requiring dialysis. Last treatment received on Saturday. After discussing the overall prognosis with patient and husband, it has been decided to proceed with CRRT for 48 hours and evaluate condition after that time. Husband states he wants her mentation cleared to ask what she would like to do and he is willing to honor her wishes. Orders placed for CRRT.   2. Acute respiratory acidosis with hypoxia. Patient placed on Bipap and given sodium bicarb in ED. Will  initiate CRRT in attempts to clear toxins.   3. Hypotension. Patient on Levo with BP 87/65. May have to increase with CRRT     LOS: 1 [place] 6/5/20233:09 PM

## 2022-04-08 LAB — TACROLIMUS LEVEL: Tacrolimus (FK506) - LabCorp: 9.9 ng/mL (ref 2.0–20.0)

## 2022-04-09 LAB — BLOOD GAS, ARTERIAL
O2 Saturation: 90.4 %
Patient temperature: 37
pH, Arterial: <6.95 — CL (ref 7.35–7.45)
pO2, Arterial: 61 mmHg — ABNORMAL LOW (ref 83–108)

## 2022-04-09 LAB — BLOOD GAS, VENOUS
Acid-base deficit: 1.8 mmol/L (ref 0.0–2.0)
Acid-base deficit: 2.4 mmol/L — ABNORMAL HIGH (ref 0.0–2.0)
Bicarbonate: 30.7 mmol/L — ABNORMAL HIGH (ref 20.0–28.0)
Bicarbonate: 31.3 mmol/L — ABNORMAL HIGH (ref 20.0–28.0)
Delivery systems: POSITIVE
Delivery systems: POSITIVE
FIO2: 60 %
FIO2: 60 %
O2 Saturation: 92.8 %
O2 Saturation: 97.7 %
Patient temperature: 37
Patient temperature: 37
pCO2, Ven: 113 mmHg (ref 44–60)
pCO2, Ven: 99 mmHg (ref 44–60)
pH, Ven: 7.05 — CL (ref 7.25–7.43)
pH, Ven: 7.1 — CL (ref 7.25–7.43)
pO2, Ven: 58 mmHg — ABNORMAL HIGH (ref 32–45)
pO2, Ven: 82 mmHg — ABNORMAL HIGH (ref 32–45)

## 2022-05-02 NOTE — Death Summary Note (Signed)
DEATH SUMMARY   Patient Details  Name: Hannah Wood MRN: [id number] DOB: April 22, 1986  Admission/Discharge Information   Admit Date:  04-20-2022  Date of Death: Date of Death: Apr 22, 2022  Time of Death: Time of Death: 0353  Length of Stay: 2  Referring Physician: Mikey College, MD   Reason(s) for Hospitalization  Dyspnea  Diagnoses  Preliminary cause of death:  Chronic lung allograft rejection Secondary Diagnoses (including complications and co-morbidities):  Principal Problem:   Acute respiratory failure ([place]) Active Problems:   Acute on chronic respiratory failure with hypoxia and hypercapnia ([facility]) Renal cell carcinoma Neuroendocrine tumor of the colon Breast cancer ESRD Failure to thrive Severe protein calorie malnutrition Chronic respiratory failure with hypoxemia Chronic hypercarbic respiratory failure Cardiogenic shock Acute on chronic respiratory acidosis Acute metabolic encephalopathy secondary to hypercarbia Profound physical deconditioning S/p bilateral orthotopic lung transplant x2  Brief Hospital Course (including significant findings, care, treatment, and services provided and events leading to death)  Hannah Wood is a 36 y.o. year old female who was admitted to [facility] for acute on chronic respiratory failure with hypercarbia and hypoxemia from chronic lung allograft dysfunction, complications of her three forms of cancer and ESRD.  She suffered from protein calorie malnutrition, physical deconditioning and had been not thriving at home as she was sleeping 16-20 hours a day at home.  She was treated for pneumonia but in reality her lungs had reached a point where she was unable to breathe without the assistance of mechanical ventilation.  She and her husband were aware of the situation and her code status prior to admission was DNR.  CRRT was attempted per the patient's husband's request, but she was very uncomfortable despite this. After consultation  with the [facility] lung transplant team the patient's husband elected for comfort measures. She died with family at the bedside.  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 FINDINGS: Left-sided central venous port tip over the SVC. Right-sided central venous catheter tip over the cavoatrial region. Sternotomy wires. Cardiomegaly with vascular congestion. Fairly extensive bilateral airspace disease and probable pleural effusions. No pneumothorax. Clips in the left axilla and chest IMPRESSION: 1. Cardiomegaly with vascular congestion and probable pleural effusions 2. Fairly extensive bilateral airspace disease Electronically Signed   By: Donavan Foil M.D.   On: 04-20-2022 20:20    Microbiology Recent Results (from the past 240 hour(s))  MRSA Next Gen by PCR, Nasal     Status: None   Collection Time: 04-20-22  2:58 AM   Specimen: Nasal Mucosa; Nasal Swab  Result Value Ref Range Status   MRSA by PCR Next Gen NOT DETECTED NOT DETECTED Final    Comment: (NOTE) The GeneXpert MRSA Assay (FDA approved for NASAL specimens only), is one component of a comprehensive MRSA colonization surveillance program. It is not intended to diagnose MRSA infection nor to guide or monitor treatment for MRSA infections. Test performance is not FDA approved in patients less than 75 years old. Performed at [facility], [address] [place], [place] [address]   SARS Coronavirus 2 by RT PCR (hospital order, performed in [facility] hospital lab) *cepheid single result test* Anterior Nasal Swab     Status: None   Collection Time: 04-20-22  8:54 PM   Specimen: Anterior Nasal Swab  Result Value  Ref Range Status   SARS Coronavirus 2 by RT PCR NEGATIVE NEGATIVE Final    Comment: (NOTE) SARS-CoV-2  target nucleic acids are NOT DETECTED.  The SARS-CoV-2 RNA is generally detectable in upper and lower respiratory specimens during the acute phase of infection. The lowest concentration of SARS-CoV-2 viral copies this assay can detect is 250 copies / mL. A negative result does not preclude SARS-CoV-2 infection and should not be used as the sole basis for treatment or other patient management decisions.  A negative result may occur with improper specimen collection / handling, submission of specimen other than nasopharyngeal swab, presence of viral mutation(s) within the areas targeted by this assay, and inadequate number of viral copies (<250 copies / mL). A negative result must be combined with clinical observations, patient history, and epidemiological information.  Fact Sheet for Patients:   https://www.patel.info/  Fact Sheet for Healthcare Providers: https://hall.com/  This test is not yet approved or  cleared by the [place] FDA and has been authorized for detection and/or diagnosis of SARS-CoV-2 by FDA under an Emergency Use Authorization (EUA).  This EUA will remain in effect (meaning this test can be used) for the duration of the COVID-19 declaration under Section 564(b)(1) of the Act, 21 U.S.C. section 360bbb-3(b)(1), unless the authorization is terminated or revoked sooner.  Performed at [facility], [address] [place], [place] [address]     Lab Basic Metabolic Panel: Recent Labs  Lab 04/04/22 1100 04/12/2022 2005 04/06/22 0501  NA  --  135 139  K 4.4 5.0 4.8  CL  --  101 101  CO2  --  31 31  GLUCOSE  --  120* 170*  BUN  --  26* 33*  CREATININE  --  3.54* 3.55*  CALCIUM  --  8.9 8.3*  MG  --  2.8* 2.3  PHOS  --  7.8* 7.1*   Liver Function Tests: Recent Labs  Lab 04/30/2022 2005 04/06/22 0501  AST 24 24  ALT 19 20  ALKPHOS 119 111  BILITOT 0.6 0.9  PROT 6.9 6.2*  ALBUMIN 3.3* 3.0*   No  results for input(s): "LIPASE", "AMYLASE" in the last 168 hours. No results for input(s): "AMMONIA" in the last 168 hours. CBC: Recent Labs  Lab 04/19/2022 2005 04/06/22 0501  WBC 7.6 8.2  HGB 9.2* 9.0*  HCT 34.1* 31.8*  MCV 108.3* 105.0*  PLT 99* 93*   Cardiac Enzymes: No results for input(s): "CKTOTAL", "CKMB", "CKMBINDEX", "TROPONINI" in the last 168 hours. Sepsis Labs: Recent Labs  Lab 04/26/2022 2005 04/06/22 0501  PROCALCITON 0.45 0.67  WBC 7.6 8.2  LATICACIDVEN  --  1.1    Procedures/Operations  CRRT NIMV   Roselie Awkward 04/09/2022, 4:57 PM

## 2022-05-02 NOTE — Progress Notes (Signed)
Spiritual Care support provided at time of death to husband and mother. Family grieving well by leaning into faith and offering a life review. [facility].     April 15, 2022 0400  Clinical Encounter Type  Visited With Family  Visit Type Death  Spiritual Encounters  Spiritual Needs Grief support;Prayer

## 2022-05-02 NOTE — Progress Notes (Signed)
Patient had transitioned to comfort care during the day. The patient's husband and mother are at the bedside. Husband refuses any comfort medications - he believes the patient is comfortable without. Education and support provided to patient's loved ones. Patient has remained sinus brady with O2 sat in the 80s for majority of the night. Husband has requested several blood pressures throughout the night. They have been documented.   Around 0330 there was a change in the patient's heart rhythm. I arrived to the bedside and witnessed, what appeared to be, seizure activity. 4 mg Ativan administered - see eMAR. Mother and husband remained at the bedside all night. Patient went asystole around 0351 - Time of death 77. Chaplain paged at [phone] and arrived to the bedside at 0359 to provide support to loved ones.   [place] contacted at [employer]. Spoke with Page. Referral # [id number]. Patient is a candidate for eye donation - She is first person authorized for donation according to her driver's license - [employer] requests eye prep (several drops of sterile normal saline in each eye and cover with sterile saline saturated gauze) before transportation to morgue.

## 2022-05-02 DEATH — deceased

## 2022-11-06 IMAGING — DX DG CHEST 1V PORT
1 series · 1 of 1 positions shown · non-contrast
Comparison: None Available.

CLINICAL DATA: Shortness of breath history of double lung
transplant currently in rejection

EXAM:
PORTABLE CHEST 1 VIEW

[chest ap]
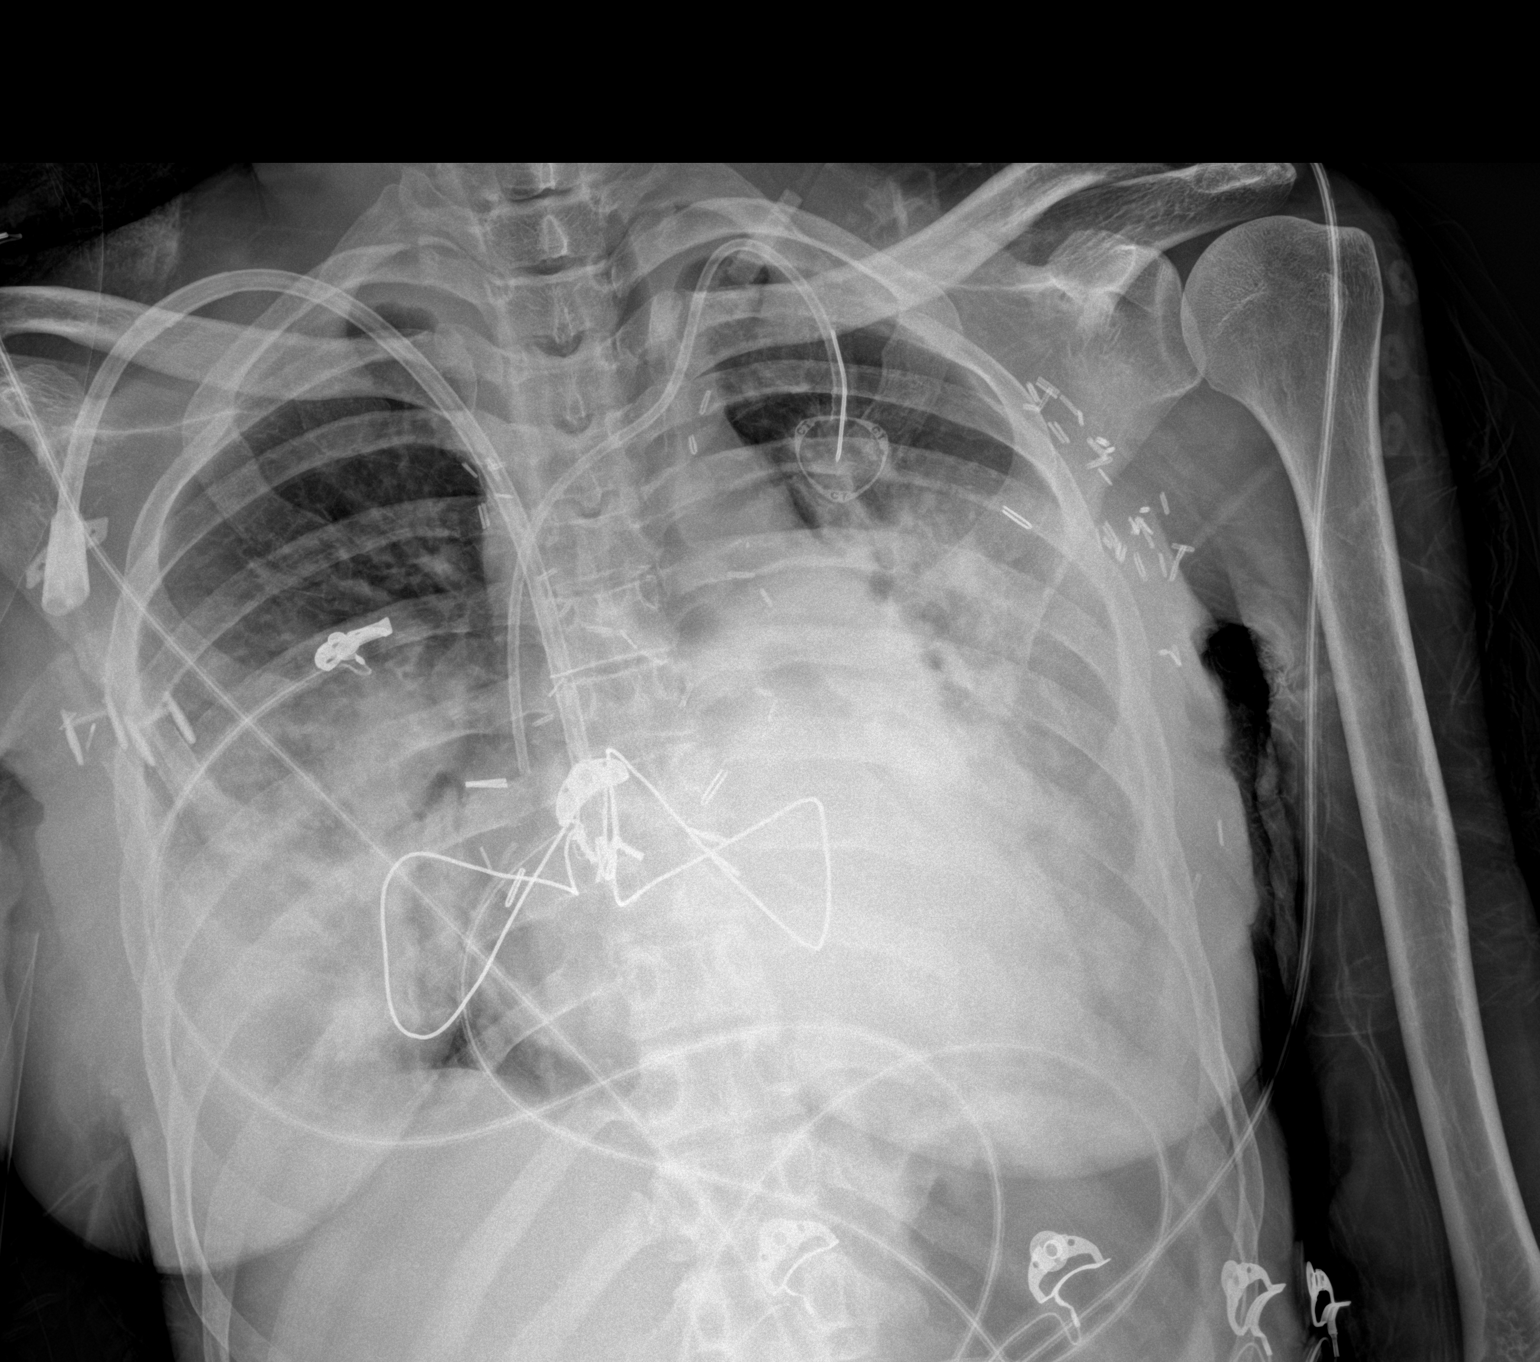

[1 of 1 positions shown; findings below may reference images not displayed]

FINDINGS: Left-sided central venous port tip over the SVC. Right-sided central
venous catheter tip over the cavoatrial region. Sternotomy wires.
Cardiomegaly with vascular congestion. Fairly extensive bilateral
airspace disease and probable pleural effusions. No pneumothorax.
Clips in the left axilla and chest
IMPRESSION: 1. Cardiomegaly with vascular congestion and probable pleural
effusions
2. Fairly extensive bilateral airspace disease
